# Patient Record
Sex: Male | Born: 1970 | Race: White | Hispanic: No | Marital: Single | Smoking: Current every day smoker
Health system: Southern US, Community
[De-identification: ages and names within clinical notes are randomized; demographics above are authoritative.]

## PROBLEM LIST (undated history)

## (undated) DIAGNOSIS — F1990 Other psychoactive substance use, unspecified, uncomplicated: Secondary | ICD-10-CM

## (undated) HISTORY — PX: NO PAST SURGERIES: SHX2092

---

## 2006-04-07 ENCOUNTER — Emergency Department: Payer: Self-pay | Admitting: Emergency Medicine

## 2010-04-23 ENCOUNTER — Emergency Department: Payer: Self-pay | Admitting: Emergency Medicine

## 2010-08-11 ENCOUNTER — Emergency Department: Payer: Self-pay | Admitting: Emergency Medicine

## 2011-05-31 ENCOUNTER — Emergency Department: Payer: Self-pay | Admitting: *Deleted

## 2011-06-02 ENCOUNTER — Emergency Department: Payer: Self-pay | Admitting: *Deleted

## 2011-10-18 ENCOUNTER — Emergency Department: Payer: Self-pay | Admitting: Emergency Medicine

## 2011-10-18 LAB — CBC WITH DIFFERENTIAL/PLATELET
Basophil #: 0.1 10*3/uL (ref 0.0–0.1)
Basophil %: 0.7 %
Eosinophil #: 0.4 10*3/uL (ref 0.0–0.7)
HGB: 14.4 g/dL (ref 13.0–18.0)
Lymphocyte #: 3.1 10*3/uL (ref 1.0–3.6)
MCH: 29.8 pg (ref 26.0–34.0)
MCHC: 33.7 g/dL (ref 32.0–36.0)
Monocyte #: 1 x10 3/mm (ref 0.2–1.0)
Neutrophil #: 13.2 10*3/uL — ABNORMAL HIGH (ref 1.4–6.5)
RBC: 4.81 10*6/uL (ref 4.40–5.90)
RDW: 13.4 % (ref 11.5–14.5)

## 2013-01-05 ENCOUNTER — Emergency Department: Payer: Self-pay | Admitting: Emergency Medicine

## 2013-06-28 ENCOUNTER — Encounter (HOSPITAL_COMMUNITY): Payer: Self-pay | Admitting: Emergency Medicine

## 2013-06-28 ENCOUNTER — Emergency Department (HOSPITAL_COMMUNITY): Payer: Self-pay

## 2013-06-28 ENCOUNTER — Emergency Department (HOSPITAL_COMMUNITY)
Admission: EM | Admit: 2013-06-28 | Discharge: 2013-06-28 | Disposition: A | Discharge status: Home / Self Care | Payer: Self-pay | Attending: Emergency Medicine | Admitting: Emergency Medicine

## 2013-06-28 DIAGNOSIS — R630 Anorexia: Secondary | ICD-10-CM | POA: Insufficient documentation

## 2013-06-28 DIAGNOSIS — N200 Calculus of kidney: Secondary | ICD-10-CM | POA: Insufficient documentation

## 2013-06-28 LAB — URINALYSIS, ROUTINE W REFLEX MICROSCOPIC
Bilirubin Urine: NEGATIVE
Glucose, UA: NEGATIVE mg/dL
Hgb urine dipstick: NEGATIVE
Ketones, ur: 15 mg/dL — AB
Leukocytes, UA: NEGATIVE
Nitrite: NEGATIVE
Protein, ur: NEGATIVE mg/dL
SPECIFIC GRAVITY, URINE: 1.038 — AB (ref 1.005–1.030)
UROBILINOGEN UA: 1 mg/dL (ref 0.0–1.0)
pH: 6 (ref 5.0–8.0)

## 2013-06-28 LAB — BASIC METABOLIC PANEL
BUN: 13 mg/dL (ref 6–23)
CO2: 28 mEq/L (ref 19–32)
Calcium: 9.8 mg/dL (ref 8.4–10.5)
Chloride: 99 mEq/L (ref 96–112)
Creatinine, Ser: 0.87 mg/dL (ref 0.50–1.35)
Glucose, Bld: 94 mg/dL (ref 70–99)
POTASSIUM: 4.5 meq/L (ref 3.7–5.3)
SODIUM: 141 meq/L (ref 137–147)

## 2013-06-28 MED ORDER — TRAMADOL HCL 50 MG PO TABS: 50.0000 mg | ORAL_TABLET | Freq: Four times a day (QID) | ORAL | Status: DC | PRN

## 2013-06-28 MED ORDER — KETOROLAC TROMETHAMINE 30 MG/ML IJ SOLN
30.0000 mg | Freq: Once | INTRAMUSCULAR | Status: AC
  Administered 2013-06-28: 30 mg via INTRAVENOUS
  Filled 2013-06-28: qty 1

## 2013-06-28 MED ORDER — HYDROCODONE-ACETAMINOPHEN 5-325 MG PO TABS: 1.0000 | ORAL_TABLET | ORAL | Status: DC | PRN

## 2013-06-28 MED ORDER — HYDROMORPHONE HCL PF 1 MG/ML IJ SOLN
1.0000 mg | Freq: Once | INTRAMUSCULAR | Status: AC
  Administered 2013-06-28: 1 mg via INTRAVENOUS
  Filled 2013-06-28: qty 1

## 2013-06-28 MED ORDER — SODIUM CHLORIDE 0.9 % IV BOLUS (SEPSIS)
1000.0000 mL | Freq: Once | INTRAVENOUS | Status: AC
  Administered 2013-06-28: 1000 mL via INTRAVENOUS

## 2013-06-28 NOTE — ED Notes (Signed)
Attempted iv access x 2 with no success

## 2013-06-28 NOTE — Discharge Instructions (Signed)
If you were given medicines take as directed.  If you are on coumadin or contraceptives realize their levels and effectiveness is altered by many different medicines.  If you have any reaction (rash, tongues swelling, other) to the medicines stop taking and see a physician.   Take ibuprofen 600 mg every 6 hrs for pain For severe pain take norco or vicodin however realize they have the potential for addiction and it can make you sleepy and has tylenol in it.  No operating machinery while taking.  Please follow up as directed and return to the ER or see a physician for new or worsening symptoms.  Thank you. Filed Vitals:   06/28/13 1400 06/28/13 1421 06/28/13 1545 06/28/13 1630  BP: 144/91 119/90 110/79 110/69  Pulse: 96 63 49 55  Temp: 98 F (36.7 C)     TempSrc: Oral     Resp: 17 10 9 15   Weight: 175 lb (79.379 kg)     SpO2: 100% 99% 100% 100%    Kidney Stones Kidney stones (urolithiasis) are solid masses that form inside your kidneys. The intense pain is caused by the stone moving through the kidney, ureter, bladder, and urethra (urinary tract). When the stone moves, the ureter starts to spasm around the stone. The stone is usually passed in your pee (urine).  HOME CARE  Drink enough fluids to keep your pee clear or pale yellow. This helps to get the stone out.  Strain all pee through the provided strainer. Do not pee without peeing through the strainer, not even once. If you pee the stone out, catch it in the strainer. The stone may be as small as a grain of salt. Take this to your doctor. This will help your doctor figure out what you can do to try to prevent more kidney stones.  Only take medicine as told by your doctor.  Follow up with your doctor as told.  Get follow-up X-rays as told by your doctor. GET HELP IF: You have pain that gets worse even if you have been taking pain medicine. GET HELP RIGHT AWAY IF:   Your pain does not get better with medicine.  You have a fever  or shaking chills.  Your pain increases and gets worse over 18 hours.  You have new belly (abdominal) pain.  You feel faint or pass out.  You are unable to pee. MAKE SURE YOU:   Understand these instructions.  Will watch your condition.  Will get help right away if you are not doing well or get worse. Document Released: 06/21/2007 Document Revised: 09/04/2012 Document Reviewed: 06/05/2012 [facility] Patient Information 2014 [employer], [place].

## 2013-06-28 NOTE — ED Notes (Signed)
Per pt sts right side pain that started about 3 am. Denies N,V. sts sharp stabbing. sts comes in waves. sts urine is darker.

## 2013-06-28 NOTE — ED Provider Notes (Signed)
CSN: [id number]     Arrival date & time 06/28/13  1354 History   First MD Initiated Contact with Patient 06/28/13 1411     Chief Complaint  Patient presents with  . Flank Pain     (Consider location/radiation/quality/duration/timing/severity/associated sxs/prior Treatment) HPI Comments: 43 year old male with no significant medical or surgical history presents with right flank pain. Issues mild ache however today became more severe where he is unable to comfortable. No history of similar or history of kidney stones. No injury or trauma. Patient denies urinary symptoms and no worsening with eating. No known gallbladder problems, fevers, chills. Nothing improves the pain. No significant radiation  Patient is a 43 y.o. male presenting with flank pain. The history is provided by the patient.  Flank Pain Pertinent negatives include no chest pain, no abdominal pain, no headaches and no shortness of breath.    History reviewed. No pertinent past medical history. History reviewed. No pertinent past surgical history. History reviewed. No pertinent family history. History  Substance Use Topics  . Smoking status: Never Smoker   . Smokeless tobacco: Not on file  . Alcohol Use: No    Review of Systems  Constitutional: Positive for appetite change. Negative for fever and chills.  HENT: Negative for congestion.   Eyes: Negative for visual disturbance.  Respiratory: Negative for shortness of breath.   Cardiovascular: Negative for chest pain.  Gastrointestinal: Negative for vomiting and abdominal pain.  Genitourinary: Positive for flank pain. Negative for dysuria.  Musculoskeletal: Negative for back pain, neck pain and neck stiffness.  Skin: Negative for rash.  Neurological: Negative for light-headedness and headaches.      Allergies  Morphine and related  Home Medications   Prior to Admission medications   Not on File   BP 119/90  Pulse 63  Temp(Src) 98 F (36.7 C) (Oral)  Resp  10  Wt 175 lb (79.379 kg)  SpO2 99% Physical Exam  Nursing note and vitals reviewed. Constitutional: He is oriented to person, place, and time. He appears well-developed and well-nourished.  HENT:  Head: Normocephalic and atraumatic.  Eyes: Conjunctivae are normal. Right eye exhibits no discharge. Left eye exhibits no discharge.  Neck: Normal range of motion. Neck supple. No tracheal deviation present.  Cardiovascular: Normal rate and regular rhythm.   Pulmonary/Chest: Effort normal and breath sounds normal.  Abdominal: Soft. He exhibits no distension. There is no tenderness. There is no guarding.  Musculoskeletal: He exhibits tenderness (mild right mid flank). He exhibits no edema.  Neurological: He is alert and oriented to person, place, and time.  Skin: Skin is warm. No rash noted.  Psychiatric: He has a normal mood and affect.    ED Course  ProceduresAnd (including critical care time) Emergency Focused Ultrasound Exam Limited retroperitoneal ultrasound of kidneys  Performed and interpreted by Dr. Jodi MourningZavitz Indication: flank pain Focused abdominal ultrasound with both kidneys imaged in transverse and longitudinal planes in real-time. Interpretation: no hydronephrosis visualized.  no stones or cysts visualized  Images archived electronically  Labs Review Labs Reviewed  URINALYSIS, ROUTINE W REFLEX MICROSCOPIC - Abnormal; Notable for the following:    Specific Gravity, Urine 1.038 (*)    Ketones, ur 15 (*)    All other components within normal limits  BASIC METABOLIC PANEL    Imaging Review Ct Abdomen Pelvis Wo Contrast  06/28/2013   CLINICAL DATA:  Right-sided flank pain. Low back pain beginning at 3 a.m.  EXAM: CT ABDOMEN AND PELVIS WITHOUT CONTRAST  TECHNIQUE: Multidetector CT  imaging of the abdomen and pelvis was performed following the standard protocol without IV contrast.  COMPARISON:  None.  FINDINGS: Mild dependent atelectasis is present bilaterally. The lung bases are  otherwise clear. Heart size is normal. No significant pleural or pericardial effusion is present.  The liver and spleen are within normal limits. The stomach, duodenum, and pancreas are unremarkable. The common bile duct and gallbladder are normal. The adrenal glands are normal bilaterally. The left kidney and ureter are normal. A punctate stone is present at the lower pole of the right kidney. There are no obstructing stones. There is no hydronephrosis. The urinary bladder is mostly collapsed.  Diverticular changes are present throughout the sigmoid colon. There is no focal inflammation to suggest diverticulitis. The remainder the colon is within normal limits. The appendix is visualized and normal. Small bowel is unremarkable. No significant adenopathy or free fluid is present.  There is some fat extending into the inguinal canals bilaterally without associated bowel.  The bone windows demonstrate no focal lytic or blastic lesions.  IMPRESSION: 1. Punctate nonobstructing stone at the lower pole of the right kidney. 2. No evidence for obstruction.  The right ureter is normal. 3. No other acute or focal lesion to explain the patient's symptoms.   Electronically Signed   By: Gennette Pachris  Mattern M.D.   On: 06/28/2013 16:34     EKG Interpretation None      MDM   Final diagnoses:  Right kidney stone   clinical concern for acute kidney stone with other differentials considered  including gallbladder, musculoskeletal, other. Bedside ultrasound performed no significant hydronephrosis visualized and with no known kidney stone history no history of CT scan for similar and discussed CT scan to confirm diagnosis patient understands it really changes medical management and he can followup with urology if it is a kidney stone.  Pain medicines, IV fluids.  Pt improved on recheck, small kidney stone.  Results and differential diagnosis were discussed with the patient/parent/guardian. Close follow up outpatient was  discussed, comfortable with the plan.   Medications  HYDROmorphone (DILAUDID) injection 1 mg (1 mg Intravenous Given 06/28/13 1507)  ketorolac (TORADOL) 30 MG/ML injection 30 mg (30 mg Intravenous Given 06/28/13 1507)  sodium chloride 0.9 % bolus 1,000 mL (1,000 mLs Intravenous New Bag/Given 06/28/13 1506)    Filed Vitals:   06/28/13 1400 06/28/13 1421  BP: 144/91 119/90  Pulse: 96 63  Temp: 98 F (36.7 C)   TempSrc: Oral   Resp: 17 10  Weight: 175 lb (79.379 kg)   SpO2: 100% 99%       Enid SkeensJoshua M Gizel Riedlinger, MD 06/28/13 1648

## 2013-06-28 NOTE — ED Notes (Signed)
Dr Jodi Mourningzavitz at bedside to eval pt

## 2013-06-28 NOTE — ED Notes (Signed)
The patient is unable to give an urine specimen at this time. The patient has been advised to use the call light for assistance. The tech has reported to the RN in charge.

## 2013-06-28 NOTE — ED Notes (Signed)
Patient transported to CT 

## 2013-06-30 ENCOUNTER — Encounter (HOSPITAL_COMMUNITY): Payer: Self-pay | Admitting: Emergency Medicine

## 2013-06-30 ENCOUNTER — Emergency Department (HOSPITAL_COMMUNITY)
Admission: EM | Admit: 2013-06-30 | Discharge: 2013-06-30 | Disposition: A | Discharge status: Home / Self Care | Payer: Self-pay | Attending: Emergency Medicine | Admitting: Emergency Medicine

## 2013-06-30 DIAGNOSIS — D72829 Elevated white blood cell count, unspecified: Secondary | ICD-10-CM | POA: Insufficient documentation

## 2013-06-30 DIAGNOSIS — Z87442 Personal history of urinary calculi: Secondary | ICD-10-CM | POA: Insufficient documentation

## 2013-06-30 DIAGNOSIS — R109 Unspecified abdominal pain: Secondary | ICD-10-CM | POA: Insufficient documentation

## 2013-06-30 DIAGNOSIS — R300 Dysuria: Secondary | ICD-10-CM | POA: Insufficient documentation

## 2013-06-30 LAB — CBC WITH DIFFERENTIAL/PLATELET
BASOS PCT: 0 % (ref 0–1)
Basophils Absolute: 0 10*3/uL (ref 0.0–0.1)
EOS ABS: 0 10*3/uL (ref 0.0–0.7)
Eosinophils Relative: 0 % (ref 0–5)
HCT: 45.1 % (ref 39.0–52.0)
Hemoglobin: 15.5 g/dL (ref 13.0–17.0)
Lymphocytes Relative: 11 % — ABNORMAL LOW (ref 12–46)
Lymphs Abs: 1.6 10*3/uL (ref 0.7–4.0)
MCH: 28.8 pg (ref 26.0–34.0)
MCHC: 34.4 g/dL (ref 30.0–36.0)
MCV: 83.8 fL (ref 78.0–100.0)
Monocytes Absolute: 0.3 10*3/uL (ref 0.1–1.0)
Monocytes Relative: 2 % — ABNORMAL LOW (ref 3–12)
NEUTROS PCT: 87 % — AB (ref 43–77)
Neutro Abs: 12.6 10*3/uL — ABNORMAL HIGH (ref 1.7–7.7)
PLATELETS: 336 10*3/uL (ref 150–400)
RBC: 5.38 MIL/uL (ref 4.22–5.81)
RDW: 13.7 % (ref 11.5–15.5)
WBC: 14.6 10*3/uL — ABNORMAL HIGH (ref 4.0–10.5)

## 2013-06-30 LAB — URINALYSIS, ROUTINE W REFLEX MICROSCOPIC
BILIRUBIN URINE: NEGATIVE
Glucose, UA: NEGATIVE mg/dL
Ketones, ur: >80 mg/dL — AB
Leukocytes, UA: NEGATIVE
Nitrite: NEGATIVE
PROTEIN: NEGATIVE mg/dL
Specific Gravity, Urine: 1.023 (ref 1.005–1.030)
UROBILINOGEN UA: 0.2 mg/dL (ref 0.0–1.0)
pH: 8.5 — ABNORMAL HIGH (ref 5.0–8.0)

## 2013-06-30 LAB — BASIC METABOLIC PANEL
BUN: 9 mg/dL (ref 6–23)
CALCIUM: 10.2 mg/dL (ref 8.4–10.5)
CO2: 18 mEq/L — ABNORMAL LOW (ref 19–32)
Chloride: 103 mEq/L (ref 96–112)
Creatinine, Ser: 0.82 mg/dL (ref 0.50–1.35)
Glucose, Bld: 135 mg/dL — ABNORMAL HIGH (ref 70–99)
Potassium: 3.9 mEq/L (ref 3.7–5.3)
SODIUM: 141 meq/L (ref 137–147)

## 2013-06-30 LAB — URINE MICROSCOPIC-ADD ON

## 2013-06-30 MED ORDER — KETOROLAC TROMETHAMINE 30 MG/ML IJ SOLN
30.0000 mg | Freq: Once | INTRAMUSCULAR | Status: AC
  Administered 2013-06-30: 30 mg via INTRAVENOUS
  Filled 2013-06-30: qty 1

## 2013-06-30 MED ORDER — HYDROMORPHONE HCL PF 1 MG/ML IJ SOLN
1.0000 mg | Freq: Once | INTRAMUSCULAR | Status: AC
  Administered 2013-06-30: 1 mg via INTRAVENOUS
  Filled 2013-06-30: qty 1

## 2013-06-30 MED ORDER — ONDANSETRON HCL 4 MG/2ML IJ SOLN
4.0000 mg | Freq: Once | INTRAMUSCULAR | Status: AC
  Administered 2013-06-30: 4 mg via INTRAVENOUS
  Filled 2013-06-30: qty 2

## 2013-06-30 MED ORDER — OXYCODONE HCL 5 MG PO TABS
5.0000 mg | ORAL_TABLET | Freq: Once | ORAL | Status: AC
  Administered 2013-06-30: 5 mg via ORAL
  Filled 2013-06-30: qty 1

## 2013-06-30 MED ORDER — ONDANSETRON HCL 4 MG PO TABS: 4.0000 mg | ORAL_TABLET | Freq: Four times a day (QID) | ORAL | Status: DC

## 2013-06-30 MED ORDER — OXYCODONE HCL 5 MG PO TABS: 5.0000 mg | ORAL_TABLET | Freq: Four times a day (QID) | ORAL | Status: DC | PRN

## 2013-06-30 NOTE — ED Provider Notes (Signed)
CSN: [id number]     Arrival date & time 06/30/13  1248 History   First MD Initiated Contact with Patient 06/30/13 1406     Chief Complaint  Patient presents with  . Flank Pain     (Consider location/radiation/quality/duration/timing/severity/associated sxs/prior Treatment) HPI Comments: Patient presents emergency department with chief complaint of right flank pain. He states that last week he was diagnosed with a kidney stone. He states that it is not past it. He states that he has run out of his pain medicine. States the pain worsened this morning. There no aggravating or alleviating factors. He does report some dysuria. He denies any fevers, chills, nausea, vomiting. States the pain is 10 out of 10. He has not followed up with urology.  The history is provided by the patient. No language interpreter was used.    History reviewed. No pertinent past medical history. History reviewed. No pertinent past surgical history. No family history on file. History  Substance Use Topics  . Smoking status: Never Smoker   . Smokeless tobacco: Not on file  . Alcohol Use: No    Review of Systems  All other systems reviewed and are negative.     Allergies  Morphine and related  Home Medications   Prior to Admission medications   Medication Sig Start Date End Date Taking? Authorizing Provider  HYDROcodone-acetaminophen (NORCO) 5-325 MG per tablet Take 1-2 tablets by mouth every 4 (four) hours as needed. 06/28/13  Yes Enid Skeens, MD  traMADol (ULTRAM) 50 MG tablet Take 1 tablet (50 mg total) by mouth every 6 (six) hours as needed. 06/28/13  Yes Enid Skeens, MD   BP 128/70  Pulse 80  Temp(Src) 98.5 F (36.9 C) (Oral)  Resp 24  SpO2 100% Physical Exam  Nursing note and vitals reviewed. Constitutional: He is oriented to person, place, and time. He appears well-developed and well-nourished.  Obviously uncomfortable  HENT:  Head: Normocephalic and atraumatic.  Eyes: Conjunctivae  and EOM are normal. Pupils are equal, round, and reactive to light. Right eye exhibits no discharge. Left eye exhibits no discharge. No scleral icterus.  Neck: Normal range of motion. Neck supple. No JVD present.  Cardiovascular: Normal rate, regular rhythm and normal heart sounds.  Exam reveals no gallop and no friction rub.   No murmur heard. Pulmonary/Chest: Effort normal and breath sounds normal. No respiratory distress. He has no wheezes. He has no rales. He exhibits no tenderness.  Abdominal: Soft. He exhibits no distension and no mass. There is no tenderness. There is no rebound and no guarding.  Right-sided CVA tenderness, no focal abdominal tenderness  Musculoskeletal: Normal range of motion. He exhibits no edema and no tenderness.  Neurological: He is alert and oriented to person, place, and time.  Skin: Skin is warm and dry.  Psychiatric: He has a normal mood and affect. His behavior is normal. Judgment and thought content normal.    ED Course  Procedures (including critical care time) Results for orders placed during the hospital encounter of 06/30/13  URINALYSIS, ROUTINE W REFLEX MICROSCOPIC      Result Value Ref Range   Color, Urine YELLOW  YELLOW   APPearance CLEAR  CLEAR   Specific Gravity, Urine 1.023  1.005 - 1.030   pH 8.5 (*) 5.0 - 8.0   Glucose, UA NEGATIVE  NEGATIVE mg/dL   Hgb urine dipstick TRACE (*) NEGATIVE   Bilirubin Urine NEGATIVE  NEGATIVE   Ketones, ur >80 (*) NEGATIVE mg/dL  Protein, ur NEGATIVE  NEGATIVE mg/dL   Urobilinogen, UA 0.2  0.0 - 1.0 mg/dL   Nitrite NEGATIVE  NEGATIVE   Leukocytes, UA NEGATIVE  NEGATIVE  URINE MICROSCOPIC-ADD ON      Result Value Ref Range   Squamous Epithelial / LPF RARE  RARE   WBC, UA 0-2  <3 WBC/hpf   RBC / HPF 0-2  <3 RBC/hpf   Bacteria, UA FEW (*) RARE   Urine-Other MUCOUS PRESENT    CBC WITH DIFFERENTIAL      Result Value Ref Range   WBC 14.6 (*) 4.0 - 10.5 K/uL   RBC 5.38  4.22 - 5.81 MIL/uL   Hemoglobin  15.5  13.0 - 17.0 g/dL   HCT 02.745.1  25.339.0 - 66.452.0 %   MCV 83.8  78.0 - 100.0 fL   MCH 28.8  26.0 - 34.0 pg   MCHC 34.4  30.0 - 36.0 g/dL   RDW 40.313.7  47.411.5 - 25.915.5 %   Platelets 336  150 - 400 K/uL   Neutrophils Relative % 87 (*) 43 - 77 %   Neutro Abs 12.6 (*) 1.7 - 7.7 K/uL   Lymphocytes Relative 11 (*) 12 - 46 %   Lymphs Abs 1.6  0.7 - 4.0 K/uL   Monocytes Relative 2 (*) 3 - 12 %   Monocytes Absolute 0.3  0.1 - 1.0 K/uL   Eosinophils Relative 0  0 - 5 %   Eosinophils Absolute 0.0  0.0 - 0.7 K/uL   Basophils Relative 0  0 - 1 %   Basophils Absolute 0.0  0.0 - 0.1 K/uL  BASIC METABOLIC PANEL      Result Value Ref Range   Sodium 141  137 - 147 mEq/L   Potassium 3.9  3.7 - 5.3 mEq/L   Chloride 103  96 - 112 mEq/L   CO2 18 (*) 19 - 32 mEq/L   Glucose, Bld 135 (*) 70 - 99 mg/dL   BUN 9  6 - 23 mg/dL   Creatinine, Ser 5.630.82  0.50 - 1.35 mg/dL   Calcium 87.510.2  8.4 - 64.310.5 mg/dL   GFR calc non Af Amer >90  >90 mL/min   GFR calc Af Amer >90  >90 mL/min   Ct Abdomen Pelvis Wo Contrast  06/28/2013   CLINICAL DATA:  Right-sided flank pain. Low back pain beginning at 3 a.m.  EXAM: CT ABDOMEN AND PELVIS WITHOUT CONTRAST  TECHNIQUE: Multidetector CT imaging of the abdomen and pelvis was performed following the standard protocol without IV contrast.  COMPARISON:  None.  FINDINGS: Mild dependent atelectasis is present bilaterally. The lung bases are otherwise clear. Heart size is normal. No significant pleural or pericardial effusion is present.  The liver and spleen are within normal limits. The stomach, duodenum, and pancreas are unremarkable. The common bile duct and gallbladder are normal. The adrenal glands are normal bilaterally. The left kidney and ureter are normal. A punctate stone is present at the lower pole of the right kidney. There are no obstructing stones. There is no hydronephrosis. The urinary bladder is mostly collapsed.  Diverticular changes are present throughout the sigmoid colon. There  is no focal inflammation to suggest diverticulitis. The remainder the colon is within normal limits. The appendix is visualized and normal. Small bowel is unremarkable. No significant adenopathy or free fluid is present.  There is some fat extending into the inguinal canals bilaterally without associated bowel.  The bone windows demonstrate no focal lytic or blastic  lesions.  IMPRESSION: 1. Punctate nonobstructing stone at the lower pole of the right kidney. 2. No evidence for obstruction.  The right ureter is normal. 3. No other acute or focal lesion to explain the patient's symptoms.   Electronically Signed   By: Gennette Pachris  Mattern M.D.   On: 06/28/2013 16:34     Imaging Review Ct Abdomen Pelvis Wo Contrast  06/28/2013   CLINICAL DATA:  Right-sided flank pain. Low back pain beginning at 3 a.m.  EXAM: CT ABDOMEN AND PELVIS WITHOUT CONTRAST  TECHNIQUE: Multidetector CT imaging of the abdomen and pelvis was performed following the standard protocol without IV contrast.  COMPARISON:  None.  FINDINGS: Mild dependent atelectasis is present bilaterally. The lung bases are otherwise clear. Heart size is normal. No significant pleural or pericardial effusion is present.  The liver and spleen are within normal limits. The stomach, duodenum, and pancreas are unremarkable. The common bile duct and gallbladder are normal. The adrenal glands are normal bilaterally. The left kidney and ureter are normal. A punctate stone is present at the lower pole of the right kidney. There are no obstructing stones. There is no hydronephrosis. The urinary bladder is mostly collapsed.  Diverticular changes are present throughout the sigmoid colon. There is no focal inflammation to suggest diverticulitis. The remainder the colon is within normal limits. The appendix is visualized and normal. Small bowel is unremarkable. No significant adenopathy or free fluid is present.  There is some fat extending into the inguinal canals bilaterally  without associated bowel.  The bone windows demonstrate no focal lytic or blastic lesions.  IMPRESSION: 1. Punctate nonobstructing stone at the lower pole of the right kidney. 2. No evidence for obstruction.  The right ureter is normal. 3. No other acute or focal lesion to explain the patient's symptoms.   Electronically Signed   By: Gennette Pachris  Mattern M.D.   On: 06/28/2013 16:34     EKG Interpretation None      MDM   Final diagnoses:  Flank pain    Patient with recent diagnosis kidney stone. Pain is 10 out of 10 here. Will treat pain, and will check labs.  Mild leukocytosis, however no UTI. No fever. Patient tolerating oral intake.  Patient states that his pain is much improved. Will discharge to home with some pain medicine. Recommend urology followup in the next couple of days. Continue to strain urine. Return precautions given. Patient understands and agrees with plan. He is stable and ready for discharge.    Roxy Horsemanobert Vallorie Niccoli, PA-C 06/30/13 1550

## 2013-06-30 NOTE — ED Notes (Signed)
Pt states that he N/V as well.

## 2013-06-30 NOTE — ED Provider Notes (Signed)
  Medical screening examination/treatment/procedure(s) were performed by non-physician practitioner and as supervising physician I was immediately available for consultation/collaboration.   EKG Interpretation None         Alexios Keown, MD 06/30/13 1625 

## 2013-06-30 NOTE — ED Notes (Signed)
Pt states that he has rt flank pain that has been ongoing since last week. Pt states that he was seen for a kidney stone and given medications. Pt states that pain has persisted. Pt states that he has been having difficulty urinating and urine has been cloudy.

## 2013-06-30 NOTE — Discharge Instructions (Signed)

## 2013-06-30 NOTE — Discharge Planning (Signed)
[facility] Community Liaison  Spoke to patient about primary care resources and establishing care with a provider. Resource guide and my contact information was provided for any future questions or concerns. No other needs expressed at this time.

## 2014-10-05 ENCOUNTER — Emergency Department
Admission: EM | Admit: 2014-10-05 | Discharge: 2014-10-06 | Disposition: A | Discharge status: Home / Self Care | Payer: Self-pay | Attending: Emergency Medicine | Admitting: Emergency Medicine

## 2014-10-05 ENCOUNTER — Encounter: Payer: Self-pay | Admitting: *Deleted

## 2014-10-05 DIAGNOSIS — R000 Tachycardia, unspecified: Secondary | ICD-10-CM | POA: Insufficient documentation

## 2014-10-05 DIAGNOSIS — Y92002 Bathroom of unspecified non-institutional (private) residence single-family (private) house as the place of occurrence of the external cause: Secondary | ICD-10-CM | POA: Insufficient documentation

## 2014-10-05 DIAGNOSIS — Y9389 Activity, other specified: Secondary | ICD-10-CM | POA: Insufficient documentation

## 2014-10-05 DIAGNOSIS — S99921A Unspecified injury of right foot, initial encounter: Secondary | ICD-10-CM | POA: Insufficient documentation

## 2014-10-05 DIAGNOSIS — Y998 Other external cause status: Secondary | ICD-10-CM | POA: Insufficient documentation

## 2014-10-05 DIAGNOSIS — Z79899 Other long term (current) drug therapy: Secondary | ICD-10-CM | POA: Insufficient documentation

## 2014-10-05 DIAGNOSIS — S99922A Unspecified injury of left foot, initial encounter: Secondary | ICD-10-CM | POA: Insufficient documentation

## 2014-10-05 DIAGNOSIS — T401X1A Poisoning by heroin, accidental (unintentional), initial encounter: Secondary | ICD-10-CM | POA: Insufficient documentation

## 2014-10-05 DIAGNOSIS — W228XXA Striking against or struck by other objects, initial encounter: Secondary | ICD-10-CM | POA: Insufficient documentation

## 2014-10-05 LAB — CBC WITH DIFFERENTIAL/PLATELET
Basophils Absolute: 0.1 10*3/uL (ref 0–0.1)
Basophils Relative: 0 %
EOS PCT: 1 %
Eosinophils Absolute: 0.1 10*3/uL (ref 0–0.7)
HEMATOCRIT: 42.5 % (ref 40.0–52.0)
Hemoglobin: 14.2 g/dL (ref 13.0–18.0)
LYMPHS PCT: 6 %
Lymphs Abs: 0.9 10*3/uL — ABNORMAL LOW (ref 1.0–3.6)
MCH: 28.4 pg (ref 26.0–34.0)
MCHC: 33.5 g/dL (ref 32.0–36.0)
MCV: 84.9 fL (ref 80.0–100.0)
MONOS PCT: 6 %
Monocytes Absolute: 1 10*3/uL (ref 0.2–1.0)
NEUTROS ABS: 14.1 10*3/uL — AB (ref 1.4–6.5)
Neutrophils Relative %: 87 %
Platelets: 229 10*3/uL (ref 150–440)
RBC: 5.01 MIL/uL (ref 4.40–5.90)
RDW: 15.1 % — ABNORMAL HIGH (ref 11.5–14.5)
WBC: 16.2 10*3/uL — ABNORMAL HIGH (ref 3.8–10.6)

## 2014-10-05 LAB — BASIC METABOLIC PANEL
ANION GAP: 7 (ref 5–15)
BUN: 15 mg/dL (ref 6–20)
CHLORIDE: 100 mmol/L — AB (ref 101–111)
CO2: 28 mmol/L (ref 22–32)
Calcium: 9.3 mg/dL (ref 8.9–10.3)
Creatinine, Ser: 1.08 mg/dL (ref 0.61–1.24)
GFR calc Af Amer: >60 mL/min (ref >60)
GFR calc non Af Amer: >60 mL/min (ref >60)
GLUCOSE: 147 mg/dL — AB (ref 65–99)
Potassium: 4.2 mmol/L (ref 3.5–5.1)
Sodium: 135 mmol/L (ref 135–145)

## 2014-10-05 MED ORDER — SODIUM CHLORIDE 0.9 % IV BOLUS (SEPSIS)
1000.0000 mL | Freq: Once | INTRAVENOUS | Status: AC
  Administered 2014-10-05: 1000 mL via INTRAVENOUS

## 2014-10-05 NOTE — ED Notes (Signed)
Pt asked to give a urine sample, again.  Pt states that he will try, pts family at the bedside and they are very agitated.

## 2014-10-05 NOTE — ED Notes (Signed)
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  Father is persistant at transferring pt out of this hospital.  Notified Dr York Cerise and Tacey Ruiz the charge nurse.

## 2014-10-05 NOTE — ED Provider Notes (Signed)
[facility] Emergency Department Stephanie Littman Note   ____________________________________________  Time seen: 9:15 PM I have reviewed the triage vital signs and the triage nursing note.  HISTORY  Chief Complaint Drug Overdose   Historian Patient, brother and father  HPI Jason Frazier is a 44 y.o. male who has a history of prior to your drug abuse with heroin, who reports being clean for 6 months, and shot IV heroine tonight. Patient started to feel lightheaded and dizzy and got the sensation that he couldn't move, and so he started kicking and struck his toes against the walls or furniture in the bathroom and is complaining of left top of the foot pain and right third fourth and fifth toe pain. Father and brother found the patient difficult to arouse and EMS gave 2 mg of Narcan in the recent did become alert. Patient denies any other drugs of abuse.  Patient is under some stress due to a custody battle over his young daughter.    No past medical history on file. history of heroin abuse.  There are no active problems to display for this patient.   No past surgical history on file.  Current Outpatient Rx  Name  Route  Sig  Dispense  Refill  . HYDROcodone-acetaminophen (NORCO) 5-325 MG per tablet   Oral   Take 1-2 tablets by mouth every 4 (four) hours as needed.   15 tablet   0   . ondansetron (ZOFRAN) 4 MG tablet   Oral   Take 1 tablet (4 mg total) by mouth every 6 (six) hours.   12 tablet   0   . oxyCODONE (ROXICODONE) 5 MG immediate release tablet   Oral   Take 1 tablet (5 mg total) by mouth every 6 (six) hours as needed for severe pain.   15 tablet   0   . traMADol (ULTRAM) 50 MG tablet   Oral   Take 1 tablet (50 mg total) by mouth every 6 (six) hours as needed.   10 tablet   0     Allergies Morphine and related  History reviewed. No pertinent family history.  Social History Social History  Substance Use Topics  . Smoking status:  Never Smoker   . Smokeless tobacco: None  . Alcohol Use: No    Review of Systems  Constitutional: Negative for fever. Eyes: Negative for visual changes. ENT: Negative for sore throat. Cardiovascular: Negative for chest pain. Respiratory: Negative for shortness of breath. Gastrointestinal: Negative for abdominal pain, vomiting and diarrhea. Genitourinary: Negative for dysuria. Musculoskeletal: Negative for back pain. Skin: Negative for rash. Neurological: Negative for headache. 10 point Review of Systems otherwise negative ____________________________________________   PHYSICAL EXAM:  VITAL SIGNS: ED Triage Vitals  Enc Vitals Group     BP 10/05/14 2120 141/89 mmHg     Pulse --      Resp 10/05/14 2120 16     Temp 10/05/14 2120 98.6 F (37 C)     Temp Source 10/05/14 2120 Oral     SpO2 10/05/14 2120 98 %     Weight 10/05/14 2120 180 lb (81.647 kg)     Height 10/05/14 2120  (1.778 m)     Head Cir --      Peak Flow --      Pain Score --      Pain Loc --      Pain Edu? --      Excl. in GC? --  Constitutional: Alert and oriented. Well appearing and in no distress. Eyes: Conjunctivae are mildly injected. PERRL. Normal extraocular movements. ENT   Head: Normocephalic and atraumatic.   Nose: No congestion/rhinnorhea.   Mouth/Throat: Mucous membranes are moist.   Neck: No stridor. Cardiovascular/Chest: Tachycardic, regular..  No murmurs, rubs, or gallops. Respiratory: Normal respiratory effort without tachypnea nor retractions. Breath sounds are clear and equal bilaterally. No wheezes/rales/rhonchi. Gastrointestinal: Soft. No distention, no guarding, no rebound. Nontender   Genitourinary/rectal:Deferred Musculoskeletal: Normal range of motion in all extremities. No joint effusions.  No lower extremity edema. Tenderness to the left top of the foot with mild swelling, tenderness at the third fourth and fifth tips of the toes. Neurologic:  Normal speech  and language. No gross or focal neurologic deficits are appreciated. Skin:  Skin is warm, dry and intact. No rash noted. Psychiatric: Mood and affect are normal. Speech and behavior are normal. Patient is remorseful. No depression or psychosis. No suicidal or homicidal ideation. Patient exhibits appropriate insight and judgment.  ____________________________________________   EKG I, Governor Rooks, MD, the attending physician have personally viewed and interpreted all ECGs.  101 bpm. Sinus tachycardia. Narrow QRS. Normal axis. Nonspecific T wave. QTc 451 ____________________________________________  LABS (pertinent positives/negatives)  Basic metabolic panel without significant abnormalities White blood cell count 16.2 with left shift, hemoglobin 14.2 and platelet count 229 Urinalysis and urine drug screen are pending Alcohol level pending  ____________________________________________  RADIOLOGY All Xrays were viewed by me. Imaging interpreted by Radiologist.  None __________________________________________  PROCEDURES  Procedure(s) performed: None  Critical Care performed: None  ____________________________________________   ED COURSE / ASSESSMENT AND PLAN  CONSULTATIONS: Consultation to TTS, for evaluation of outpatient options for drug abuse.  Pertinent labs & imaging results that were available during my care of the patient were reviewed by me and considered in my medical decision making (see chart for details).  Patient is currently awake and remorseful over returning to IV heroin use tonight with accidental/unintentional overdose. There is no active depression or suicidal ideation. Patient will be monitored for several hours to assure stability and no need for additional Narcan.  I don't feel the patient needs inpatient psychiatry, he appears to have good social network with his father and brother and is remorseful about what happened tonight. I will have the  psychiatry social worker talk with the patient to help with outpatient resources.  ----------------------------------------- 11:38 PM on 10/05/2014 -----------------------------------------  No additional somnolence or evidence of return of opioid intoxication. Patient will continue to be monitored for several hours. Patient care transferred to Dr. Manson Passey, midnight. Urinalysis, urine drug screen, and alcohol level are pending.  Patient / Family / Caregiver informed of clinical course, medical decision-making process, and agree with plan.    ___________________________________________   FINAL CLINICAL IMPRESSION(S) / ED DIAGNOSES   Final diagnoses:  Heroin overdose, accidental or unintentional, initial encounter       Governor Rooks, MD 10/05/14 2340

## 2014-10-05 NOTE — ED Notes (Signed)
2 mg Narcan per EMS, pt was more arousable.  Pt id A&O x 4 at this time, Dr Shaune Pollack at bedside, spoke with family.  Father much calmer at this time.

## 2014-10-06 ENCOUNTER — Emergency Department: Payer: Self-pay

## 2014-10-06 LAB — ETHANOL

## 2014-10-06 MED ORDER — ACETAMINOPHEN 325 MG PO TABS
ORAL_TABLET | ORAL | Status: AC
  Administered 2014-10-06: 650 mg via ORAL
  Filled 2014-10-06: qty 2

## 2014-10-06 MED ORDER — ACETAMINOPHEN 325 MG PO TABS
650.0000 mg | ORAL_TABLET | Freq: Once | ORAL | Status: AC
  Administered 2014-10-06: 650 mg via ORAL

## 2014-10-06 NOTE — BH Assessment (Signed)
Assessment Note  Jason Frazier is an 44 y.o. male. Jason Frazier reports to the ED by EMS after what he reports as an accidental overdose.  He reports that he had previously use drugs for 2 years. He was able to be clean for 6 months, until today.  He reports a friend left a bag of heroin at his home and he used it.  He states that he did not know it would be that strong, and he accidentally overdosed.  He denied being depressed or anxious.  He denied heaving auditory or visual hallucinations. He denied suicidal or homicidal ideation or intent.   Axis I: Substance Abuse Axis II: Deferred Axis III: No past medical history on file. Axis IV: problems with primary support group Axis V: 41-50 serious symptoms  Past Medical History: No past medical history on file.  No past surgical history on file.  Family History: History reviewed. No pertinent family history.  Social History:  reports that he has never smoked. He does not have any smokeless tobacco history on file. He reports that he uses illicit drugs (IV). He reports that he does not drink alcohol.  Additional Social History:  Alcohol / Drug Use History of alcohol / drug use?: Yes (Reports stopped using 6 months ago, found a bag in the home and he relapsed) Longest period of sobriety (when/how long): 6 months  CIWA: CIWA-Ar BP: (!) 135/108 mmHg Pulse Rate: (!) 122 COWS:    Allergies:  Allergies  Allergen Reactions  . Morphine And Related Hives    Home Medications:  (Not in a hospital admission)  OB/GYN Status:  No LMP for male patient.  General Assessment Data Location of Assessment: [facility] ED TTS Assessment: In system Is this a Tele or Face-to-Face Assessment?: Face-to-Face Is this an Initial Assessment or a Re-assessment for this encounter?: Initial Assessment Marital status: Single Maiden name: n/a Is patient pregnant?: No Pregnancy Status: No Living Arrangements: Other relatives Can pt return to current living  arrangement?: Yes Admission Status: Voluntary Is patient capable of signing voluntary admission?: Yes Referral Source: Other Insurance type: None reported  Medical Screening Exam [facility] Walk-in ONLY) Medical Exam completed: Yes  Crisis Care Plan Living Arrangements: Other relatives Name of Psychiatrist: None  Name of Therapist: None  Education Status Is patient currently in school?: No Current Grade: n/a Highest grade of school patient has completed: 12th Name of school: n/a Contact person: n/a  Risk to self with the past 6 months Suicidal Ideation: No Has patient been a risk to self within the past 6 months prior to admission? : No Suicidal Intent: No Has patient had any suicidal intent within the past 6 months prior to admission? : No Is patient at risk for suicide?: No Suicidal Plan?: No Has patient had any suicidal plan within the past 6 months prior to admission? : No Access to Means: No What has been your use of drugs/alcohol within the last 12 months?: Heroin Previous Attempts/Gestures: No How many times?: 0 Other Self Harm Risks: None reported Triggers for Past Attempts: None known Intentional Self Injurious Behavior: None Family Suicide History: No Recent stressful life event(s): Conflict (Comment) (Custody court hearing ) Persecutory voices/beliefs?: No Depression: No Depression Symptoms:  (denied) Substance abuse history and/or treatment for substance abuse?: Yes Suicide prevention information given to non-admitted patients: Not applicable  Risk to Others within the past 6 months Homicidal Ideation: No Does patient have any lifetime risk of violence toward others beyond the six months prior  to admission? : No Thoughts of Harm to Others: No Current Homicidal Intent: No Current Homicidal Plan: No Access to Homicidal Means: No Identified Victim: None reported History of harm to others?: No Assessment of Violence: None Noted Violent Behavior Description:  None reported Criminal Charges Pending?: No Does patient have a court date: No Is patient on probation?: No  Psychosis Hallucinations: None noted Delusions: None noted  Mental Status Report Appearance/Hygiene: Unremarkable Eye Contact: Fair Motor Activity: Unremarkable Speech: Unremarkable Level of Consciousness: Alert Mood: Silly Affect: Silly Anxiety Level: None Thought Processes: Relevant Judgement: Partial Orientation: Person, Place, Situation, Time Obsessive Compulsive Thoughts/Behaviors: None  Cognitive Functioning Concentration: Normal Memory: Recent Intact Insight: Fair Impulse Control: Fair Appetite: Good Sleep: No Change Vegetative Symptoms: None  ADLScreening [facility] Assessment Services) Patient's cognitive ability adequate to safely complete daily activities?: Yes Patient able to express need for assistance with ADLs?: Yes Independently performs ADLs?: Yes (appropriate for developmental age)  Prior Inpatient Therapy Prior Inpatient Therapy: No  Prior Outpatient Therapy Prior Outpatient Therapy: No Does patient have an ACCT team?: No Does patient have Intensive In-House Services?  : No Does patient have [facility] services? : No Does patient have P4CC services?: No  ADL Screening (condition at time of admission) Patient's cognitive ability adequate to safely complete daily activities?: Yes Patient able to express need for assistance with ADLs?: Yes Independently performs ADLs?: Yes (appropriate for developmental age)       Abuse/Neglect Assessment (Assessment to be complete while patient is alone) Physical Abuse: Denies Verbal Abuse: Denies Sexual Abuse: Denies Exploitation of patient/patient's resources: Denies Self-Neglect: Denies Values / Beliefs Cultural Requests During Hospitalization: None Spiritual Requests During Hospitalization: None   Advance Directives (For Healthcare) Does patient have an advance directive?: No Would patient like  information on creating an advanced directive?: No - patient declined information    Additional Information 1:1 In Past 12 Months?: No CIRT Risk: No Elopement Risk: No Does patient have medical clearance?: Yes     Disposition:  Disposition Initial Assessment Completed for this Encounter: Yes Disposition of Patient: Outpatient treatment  On Site Evaluation by:   Reviewed with Physician:    Justice Deeds 10/06/2014 1:27 AM

## 2014-10-06 NOTE — Discharge Instructions (Signed)
Narcotic Overdose  A narcotic overdose is the misuse or overuse of a narcotic drug. A narcotic overdose can make you pass out and stop breathing. If you are not treated right away, this can cause permanent brain damage or stop your heart. Medicine may be given to reverse the effects of an overdose. If so, this medicine may bring on withdrawal symptoms. The symptoms may be abdominal cramps, throwing up (vomiting), sweating, chills, and nervousness.  Injecting narcotics can cause more problems than just an overdose. AIDS, hepatitis, and other very serious infections are transmitted by sharing needles and syringes. If you decide to quit using, there are medicines which can help you through the withdrawal period. Trying to quit all at once on your own can be uncomfortable, but not life-threatening. Call your caregiver, Narcotics Anonymous, or any drug and alcohol treatment program for further help.   Document Released: 02/10/2004 Document Revised: 03/27/2011 Document Reviewed: 12/04/2008  ExitCare® Patient Information ©2015 [employer]. This information is not intended to replace advice given to you by your health care provider. Make sure you discuss any questions you have with your health care provider.

## 2014-10-06 NOTE — ED Provider Notes (Signed)
I assumed care of the patient from Dr. Shaune Pollack at 11:00 PM. Patient was evaluated by behavioral medical staff. Patient denies any suicidal ideation. Patient and his guests that were in his room very jovial at time of my evaluation. I informed the patient of all clinical findings including his x-ray results which were negative. DG Foot Complete Left (Final result) Result time: 10/06/14 01:23:12   Final result by Rad Results In Interface (10/06/14 01:23:12)   Narrative:   CLINICAL DATA: Toe pain  EXAM: LEFT FOOT - COMPLETE 3+ VIEW  COMPARISON: None.  FINDINGS: No fracture or dislocation is seen.  The joint spaces are preserved.  Visualized soft tissues are within normal limits.  IMPRESSION: No fracture or dislocation is seen.   Electronically Signed By: Charline Bills M.D. On: 10/06/2014 01:23          DG Foot Complete Right (Final result) Result time: 10/06/14 01:22:50   Final result by Rad Results In Interface (10/06/14 01:22:50)   Narrative:   CLINICAL DATA: Toe pain  EXAM: RIGHT FOOT COMPLETE - 3+ VIEW  COMPARISON: None.  FINDINGS: No fracture or dislocation is seen.  The joint spaces are preserved.  Visualized soft tissues are within normal limits.  IMPRESSION: No fracture or dislocation is seen.   Electronically Signed By: Charline Bills M.D. On: 10/06/2014 01:22     Darci Current, MD 10/06/14 [phone]

## 2015-02-08 ENCOUNTER — Encounter: Payer: Self-pay | Admitting: Emergency Medicine

## 2015-02-08 ENCOUNTER — Emergency Department
Admission: EM | Admit: 2015-02-08 | Discharge: 2015-02-08 | Disposition: A | Discharge status: Home / Self Care | Payer: Self-pay | Attending: Emergency Medicine | Admitting: Emergency Medicine

## 2015-02-08 DIAGNOSIS — Z79899 Other long term (current) drug therapy: Secondary | ICD-10-CM | POA: Insufficient documentation

## 2015-02-08 DIAGNOSIS — F17200 Nicotine dependence, unspecified, uncomplicated: Secondary | ICD-10-CM | POA: Insufficient documentation

## 2015-02-08 DIAGNOSIS — R400 Somnolence: Secondary | ICD-10-CM | POA: Insufficient documentation

## 2015-02-08 DIAGNOSIS — R079 Chest pain, unspecified: Secondary | ICD-10-CM | POA: Insufficient documentation

## 2015-02-08 DIAGNOSIS — F1110 Opioid abuse, uncomplicated: Secondary | ICD-10-CM | POA: Insufficient documentation

## 2015-02-08 NOTE — Discharge Instructions (Signed)
Opioid Use Disorder  Opioid use disorder is a mental disorder. It is the continued nonmedical use of opioids in spite of risks to health and well-being. Misused opioids include the street drug heroin. They also include pain medicines such as morphine, hydrocodone, oxycodone, and fentanyl. Opioids are very addictive. People who misuse opioids get an exaggerated feeling of well-being. Opioid use disorder often disrupts activities at home, work, or school. It may cause mental or physical problems.   A family history of opioid use disorder puts you at higher risk of it. People with opioid use disorder often misuse other drugs or have mental illness such as depression, posttraumatic stress disorder, or antisocial personality disorder. They also are at risk of suicide and death from overdose.  SIGNS AND SYMPTOMS   Signs and symptoms of opioid use disorder include:  · Use of opioids in larger amounts or over a longer period than intended.  · Unsuccessful attempts to cut down or control opioid use.  · A lot of time spent obtaining, using, or recovering from the effects of opioids.  · A strong desire or urge to use opioids (craving).  · Continued use of opioids in spite of major problems at work, school, or home because of use.  · Continued use of opioids in spite of relationship problems because of use.  · Giving up or cutting down on important life activities because of opioid use.  · Use of opioids over and over in situations when it is physically hazardous, such as driving a car.  · Continued use of opioids in spite of a physical problem that is likely related to use. Physical problems can include:  ¨ Severe constipation.  ¨ Poor nutrition.  ¨ Infertility.  ¨ Tuberculosis.  ¨ Aspiration pneumonia.  ¨ Infections such as human immunodeficiency virus (HIV) and hepatitis (from injecting opioids).  · Continued use of opioids in spite of a mental problem that is likely related to use. Mental problems can  include:  ¨ Depression.  ¨ Anxiety.  ¨ Hallucinations.  ¨ Sleep problems.  ¨ Loss of sexual function.  · Need to use more and more opioids to get the same effect, or lessened effect over time with use of the same amount (tolerance).  · Having withdrawal symptoms when opioid use is stopped, or using opioids to reduce or avoid withdrawal symptoms. Withdrawal symptoms include:  ¨ Depressed, anxious, or irritable mood.  ¨ Nausea, vomiting, diarrhea, or intestinal cramping.  ¨ Muscle aches or spasms.  ¨ Excessive tearing or runny nose.  ¨ Dilated pupils, sweating, or hairs standing on end.  ¨ Yawning.  ¨ Fever, raised blood pressure, or fast pulse.  ¨ Restlessness or trouble sleeping. This does not apply to people taking opioids for medical reasons only.  DIAGNOSIS  Opioid use disorder is diagnosed by your health care provider. You may be asked questions about your opioid use and and how it affects your life. A physical exam may be done. A drug screen may be ordered. You may be referred to a mental health professional. The diagnosis of opioid use disorder requires at least two symptoms within 12 months. The type of opioid use disorder you have depends on the number of signs and symptoms you have. The type may be:  · Mild. Two or three signs and symptoms.     · Moderate. Four or five signs and symptoms.    · Severe. Six or more signs and symptoms.  TREATMENT   Treatment is usually provided by mental   health professionals with training in substance use disorders. The following options are available:  · Detoxification. This is the first step in treatment for withdrawal. It is medically supervised withdrawal with the use of medicines. These medicines lessen withdrawal symptoms. They also raise the chance of becoming opioid free.  · Counseling, also known as talk therapy. Talk therapy addresses the reasons you use opioids. It also addresses ways to keep you from using again (relapse). The goals of talk therapy are to avoid  relapse by:  ¨ Identifying and avoiding triggers for use.  ¨ Finding healthy ways to cope with stress.  ¨ Learning how to handle cravings.  · Support groups. Support groups provide emotional support, advice, and guidance.  · A medicine that blocks opioid receptors in your brain. This medicine can reduce opioid cravings that lead to relapse. This medicine also blocks the desired opioid effect when relapse occurs.  · Opioids that are taken by mouth in place of the misused opioid (opioid maintenance treatment). These medicines satisfy cravings but are safer than commonly misused opioids. This often is the best option for people who continue to relapse with other treatments.  HOME CARE INSTRUCTIONS   · Take medicines only as directed by your health care provider.  · Check with your health care provider before starting new medicines.  · Keep all follow-up visits as directed by your health care provider.  SEEK MEDICAL CARE IF:  · You are not able to take your medicines as directed.  · Your symptoms get worse.  SEEK IMMEDIATE MEDICAL CARE IF:  · You have serious thoughts about hurting yourself or others.  · You may have taken an overdose of opioids.  FOR MORE INFORMATION  · National Institute on Drug Abuse: www.drugabuse.gov  · Substance Abuse and Mental Health Services Administration: www.samhsa.gov     This information is not intended to replace advice given to you by your health care provider. Make sure you discuss any questions you have with your health care provider.     Document Released: 10/30/2006 Document Revised: 01/23/2014 Document Reviewed: 01/15/2013  Elsevier Interactive Patient Education ©2016 [employer]

## 2015-02-08 NOTE — ED Notes (Signed)
Brought in via ems from home  Per ems he snorted a percocet and became apneic .Marland Kitchenwas given 2 mg of Narcan nasally  Alert on arrival w/o complaints

## 2015-02-08 NOTE — ED Provider Notes (Signed)
[facility] Emergency Department Provider Note  ____________________________________________  Time seen: 3:00 PM  I have reviewed the triage vital signs and the nursing notes.   HISTORY  Chief Complaint Ingestion    HPI Jason Frazier is a 45 y.o. male sent to the ED by his father after he snorted a 20 mg Percocet and subsequently became somnolent and passed out for a period of time. He is estimates that this is about 12:00 PM that he snorted the Percocet. He denies any complaints at this time. He was not trying to harm himself and denies SI or HI or hallucinations. He does report that he had an opioid use problem in the past days been clean for several years. He's been under increased stress recently due to loss of job and threat of having his utilities cut off due to nonpayment of his bills and needed an escape.     History reviewed. No pertinent past medical history. Opioid dependence in remission  There are no active problems to display for this patient.    History reviewed. No pertinent past surgical history.   Current Outpatient Rx  Name  Route  Sig  Dispense  Refill  . HYDROcodone-acetaminophen (NORCO) 5-325 MG per tablet   Oral   Take 1-2 tablets by mouth every 4 (four) hours as needed.   15 tablet   0   . ondansetron (ZOFRAN) 4 MG tablet   Oral   Take 1 tablet (4 mg total) by mouth every 6 (six) hours.   12 tablet   0   . oxyCODONE (ROXICODONE) 5 MG immediate release tablet   Oral   Take 1 tablet (5 mg total) by mouth every 6 (six) hours as needed for severe pain.   15 tablet   0   . traMADol (ULTRAM) 50 MG tablet   Oral   Take 1 tablet (50 mg total) by mouth every 6 (six) hours as needed.   10 tablet   0      Allergies Morphine and related   No family history on file.  Social History Social History  Substance Use Topics  . Smoking status: Current Every Day Smoker  . Smokeless tobacco: None  . Alcohol Use: No     Review of Systems  Constitutional:   No fever or chills. No weight changes Eyes:   No blurry vision or double vision.  ENT:   No sore throat. Cardiovascular:   Mild left anterior chest pain, nonradiating, nonexertional nonpleuritic, sharp, intermittent and fleeting,his symptoms. Respiratory:   No dyspnea or cough. Gastrointestinal:   Negative for abdominal pain, vomiting and diarrhea.  No BRBPR or melena. Genitourinary:   Negative for dysuria, urinary retention, bloody urine, or difficulty urinating. Musculoskeletal:   Negative for back pain. No joint swelling or pain. Skin:   Negative for rash. Neurological:   Negative for headaches, focal weakness or numbness. Psychiatric:  No anxiety or depression.   Endocrine:  No hot/cold intolerance, changes in energy, or sleep difficulty.  10-point ROS otherwise negative.  ____________________________________________   PHYSICAL EXAM:  VITAL SIGNS: ED Triage Vitals  Enc Vitals Group     BP 02/08/15 1402 147/83 mmHg     Pulse Rate 02/08/15 1402 105     Resp 02/08/15 1402 18     Temp 02/08/15 1402 98.2 F (36.8 C)     Temp Source 02/08/15 1402 Oral     SpO2 02/08/15 1402 98 %     Weight 02/08/15 1402  175 lb (79.379 kg)     Height 02/08/15 1402  (1.753 m)     Head Cir --      Peak Flow --      Pain Score --      Pain Loc --      Pain Edu? --      Excl. in GC? --     Vital signs reviewed, nursing assessments reviewed.   Constitutional:   Alert and oriented. Well appearing and in no distress. Eyes:   No scleral icterus. No conjunctival pallor. PERRL. EOMI ENT   Head:   Normocephalic and atraumatic.   Nose:   No congestion/rhinnorhea. No septal hematoma   Mouth/Throat:   MMM, no pharyngeal erythema. No peritonsillar mass. No uvula shift.   Neck:   No stridor. No SubQ emphysema. No meningismus. Hematological/Lymphatic/Immunilogical:   No cervical lymphadenopathy. Cardiovascular:   RRR. Normal and symmetric  distal pulses are present in all extremities. No murmurs, rubs, or gallops. Respiratory:   Normal respiratory effort without tachypnea nor retractions. Breath sounds are clear and equal bilaterally. No wheezes/rales/rhonchi. Gastrointestinal:   Soft and nontender. No distention. There is no CVA tenderness.  No rebound, rigidity, or guarding. Genitourinary:   deferred Musculoskeletal:   Nontender with normal range of motion in all extremities. No joint effusions.  No lower extremity tenderness.  No edema. Neurologic:   Normal speech and language.  CN 2-10 normal. Motor grossly intact. No pronator drift.  Normal gait. No gross focal neurologic deficits are appreciated.  Skin:    Skin is warm, dry and intact. No rash noted.  No petechiae, purpura, or bullae. Psychiatric:   Mood and affect are normal. Speech and behavior are normal. Patient exhibits appropriate insight and judgment.  ____________________________________________    LABS (pertinent positives/negatives) (all labs ordered are listed, but only abnormal results are displayed) Labs Reviewed - No data to display ____________________________________________   EKG  Interpreted by me Sinus tachycardia rate 12, normal axis intervals QRS and ST segments and T waves  ____________________________________________    RADIOLOGY    ____________________________________________   PROCEDURES   ____________________________________________   INITIAL IMPRESSION / ASSESSMENT AND PLAN / ED COURSE  Pertinent labs & imaging results that were available during my care of the patient were reviewed by me and considered in my medical decision making (see chart for details).  Patient presents due to a period of intoxication somnolence after snorting a Percocet at home. He is back to baseline and well-appearing at this time. This appears to been a pharmacologic misadventure and he was not trying to harm himself and he appears  psychiatrically stable at this time. No indication for any additional workup. I don't think the chest pain represents ACS PE TAD pneumothorax carditis mediastinitis or pneumonia.     ____________________________________________   FINAL CLINICAL IMPRESSION(S) / ED DIAGNOSES  Final diagnoses:  Opioid abuse      Sharman Cheek, MD 02/08/15 [phone]

## 2015-02-08 NOTE — ED Notes (Signed)
See triage  Pt is alert /oriented ..states he has done this in the past w/o any problems. Also  states he does not want any help with drug use

## 2017-03-30 ENCOUNTER — Emergency Department (HOSPITAL_COMMUNITY): Admission: EM | Admit: 2017-03-30 | Discharge: 2017-03-30 | Discharge status: Against Medical Advice | Payer: Self-pay

## 2017-04-04 ENCOUNTER — Emergency Department: Payer: Self-pay

## 2017-04-04 ENCOUNTER — Encounter: Payer: Self-pay | Admitting: Emergency Medicine

## 2017-04-04 ENCOUNTER — Inpatient Hospital Stay
Admission: EM | Admit: 2017-04-04 | Discharge: 2017-04-06 | DRG: 580 | Disposition: A | Discharge status: Home / Self Care | Payer: Self-pay | Attending: Internal Medicine | Admitting: Internal Medicine

## 2017-04-04 ENCOUNTER — Observation Stay: Payer: Self-pay

## 2017-04-04 ENCOUNTER — Other Ambulatory Visit: Payer: Self-pay

## 2017-04-04 DIAGNOSIS — L03113 Cellulitis of right upper limb: Secondary | ICD-10-CM | POA: Diagnosis present

## 2017-04-04 DIAGNOSIS — F1910 Other psychoactive substance abuse, uncomplicated: Secondary | ICD-10-CM | POA: Diagnosis present

## 2017-04-04 DIAGNOSIS — L02413 Cutaneous abscess of right upper limb: Secondary | ICD-10-CM | POA: Diagnosis present

## 2017-04-04 DIAGNOSIS — M65831 Other synovitis and tenosynovitis, right forearm: Secondary | ICD-10-CM | POA: Diagnosis present

## 2017-04-04 DIAGNOSIS — L02511 Cutaneous abscess of right hand: Principal | ICD-10-CM | POA: Diagnosis present

## 2017-04-04 DIAGNOSIS — Z885 Allergy status to narcotic agent status: Secondary | ICD-10-CM

## 2017-04-04 DIAGNOSIS — L0390 Cellulitis, unspecified: Secondary | ICD-10-CM | POA: Diagnosis present

## 2017-04-04 DIAGNOSIS — F1110 Opioid abuse, uncomplicated: Secondary | ICD-10-CM | POA: Diagnosis present

## 2017-04-04 DIAGNOSIS — F17210 Nicotine dependence, cigarettes, uncomplicated: Secondary | ICD-10-CM | POA: Diagnosis present

## 2017-04-04 HISTORY — DX: Other psychoactive substance use, unspecified, uncomplicated: F19.90

## 2017-04-04 LAB — CBC WITH DIFFERENTIAL/PLATELET
Basophils Absolute: 0.1 10*3/uL (ref 0–0.1)
Basophils Relative: 1 %
EOS ABS: 0.2 10*3/uL (ref 0–0.7)
EOS PCT: 1 %
HCT: 41.8 % (ref 40.0–52.0)
Hemoglobin: 14.1 g/dL (ref 13.0–18.0)
LYMPHS ABS: 3.2 10*3/uL (ref 1.0–3.6)
LYMPHS PCT: 18 %
MCH: 28 pg (ref 26.0–34.0)
MCHC: 33.8 g/dL (ref 32.0–36.0)
MCV: 82.7 fL (ref 80.0–100.0)
Monocytes Absolute: 1.4 10*3/uL — ABNORMAL HIGH (ref 0.2–1.0)
Monocytes Relative: 8 %
Neutro Abs: 12.8 10*3/uL — ABNORMAL HIGH (ref 1.4–6.5)
Neutrophils Relative %: 72 %
PLATELETS: 419 10*3/uL (ref 150–440)
RBC: 5.05 MIL/uL (ref 4.40–5.90)
RDW: 14.7 % — ABNORMAL HIGH (ref 11.5–14.5)
WBC: 17.7 10*3/uL — ABNORMAL HIGH (ref 3.8–10.6)

## 2017-04-04 LAB — COMPREHENSIVE METABOLIC PANEL
ALK PHOS: 142 U/L — AB (ref 38–126)
ALT: 157 U/L — ABNORMAL HIGH (ref 17–63)
ANION GAP: 12 (ref 5–15)
AST: 93 U/L — ABNORMAL HIGH (ref 15–41)
Albumin: 4.3 g/dL (ref 3.5–5.0)
BUN: 8 mg/dL (ref 6–20)
CALCIUM: 9.4 mg/dL (ref 8.9–10.3)
CHLORIDE: 97 mmol/L — AB (ref 101–111)
CO2: 25 mmol/L (ref 22–32)
CREATININE: 0.65 mg/dL (ref 0.61–1.24)
Glucose, Bld: 114 mg/dL — ABNORMAL HIGH (ref 65–99)
Potassium: 3.7 mmol/L (ref 3.5–5.1)
SODIUM: 134 mmol/L — AB (ref 135–145)
Total Bilirubin: 0.6 mg/dL (ref 0.3–1.2)
Total Protein: 9 g/dL — ABNORMAL HIGH (ref 6.5–8.1)

## 2017-04-04 LAB — LACTIC ACID, PLASMA
LACTIC ACID, VENOUS: 0.7 mmol/L (ref 0.5–1.9)
LACTIC ACID, VENOUS: 0.5 mmol/L (ref 0.5–1.9)

## 2017-04-04 MED ORDER — VANCOMYCIN HCL IN DEXTROSE 1-5 GM/200ML-% IV SOLN
1000.0000 mg | Freq: Once | INTRAVENOUS | Status: AC
  Administered 2017-04-04: 1000 mg via INTRAVENOUS
  Filled 2017-04-04: qty 200

## 2017-04-04 MED ORDER — ONDANSETRON HCL 4 MG PO TABS: 4.0000 mg | ORAL_TABLET | Freq: Four times a day (QID) | ORAL | Status: DC | PRN

## 2017-04-04 MED ORDER — MORPHINE SULFATE (PF) 4 MG/ML IV SOLN
4.0000 mg | Freq: Once | INTRAVENOUS | Status: AC
  Administered 2017-04-04: 4 mg via INTRAVENOUS
  Filled 2017-04-04: qty 1

## 2017-04-04 MED ORDER — ACETAMINOPHEN 325 MG PO TABS
650.0000 mg | ORAL_TABLET | Freq: Four times a day (QID) | ORAL | Status: DC | PRN
  Administered 2017-04-05: 650 mg via ORAL
  Filled 2017-04-04: qty 2

## 2017-04-04 MED ORDER — OXYCODONE HCL 5 MG PO TABS
5.0000 mg | ORAL_TABLET | Freq: Four times a day (QID) | ORAL | Status: DC | PRN
  Administered 2017-04-04 – 2017-04-05 (×3): 5 mg via ORAL
  Filled 2017-04-04 (×3): qty 1

## 2017-04-04 MED ORDER — ENOXAPARIN SODIUM 40 MG/0.4ML ~~LOC~~ SOLN: 40.0000 mg | SUBCUTANEOUS | Status: DC

## 2017-04-04 MED ORDER — VANCOMYCIN HCL 10 G IV SOLR
1250.0000 mg | Freq: Three times a day (TID) | INTRAVENOUS | Status: DC
  Administered 2017-04-04 – 2017-04-06 (×5): 1250 mg via INTRAVENOUS
  Filled 2017-04-04 (×8): qty 1250

## 2017-04-04 MED ORDER — ONDANSETRON HCL 4 MG/2ML IJ SOLN: 4.0000 mg | Freq: Four times a day (QID) | INTRAMUSCULAR | Status: DC | PRN

## 2017-04-04 MED ORDER — PIPERACILLIN-TAZOBACTAM 3.375 G IVPB
3.3750 g | Freq: Three times a day (TID) | INTRAVENOUS | Status: DC
  Administered 2017-04-04 – 2017-04-06 (×5): 3.375 g via INTRAVENOUS
  Filled 2017-04-04 (×5): qty 50

## 2017-04-04 MED ORDER — PIPERACILLIN-TAZOBACTAM 3.375 G IVPB 30 MIN
3.3750 g | Freq: Once | INTRAVENOUS | Status: AC
  Administered 2017-04-04: 3.375 g via INTRAVENOUS
  Filled 2017-04-04: qty 50

## 2017-04-04 MED ORDER — ACETAMINOPHEN 650 MG RE SUPP: 650.0000 mg | Freq: Four times a day (QID) | RECTAL | Status: DC | PRN

## 2017-04-04 MED ORDER — GADOBENATE DIMEGLUMINE 529 MG/ML IV SOLN
15.0000 mL | Freq: Once | INTRAVENOUS | Status: AC | PRN
  Administered 2017-04-04: 15 mL via INTRAVENOUS

## 2017-04-04 MED ORDER — MORPHINE SULFATE (PF) 2 MG/ML IV SOLN
2.0000 mg | INTRAVENOUS | Status: DC | PRN
  Administered 2017-04-06: 2 mg via INTRAVENOUS
  Filled 2017-04-04: qty 1

## 2017-04-04 MED ORDER — ENOXAPARIN SODIUM 40 MG/0.4ML ~~LOC~~ SOLN
40.0000 mg | SUBCUTANEOUS | Status: DC
  Administered 2017-04-04: 40 mg via SUBCUTANEOUS
  Filled 2017-04-04: qty 0.4
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
## 2017-04-04 NOTE — ED Notes (Signed)
ED Provider at bedside. 

## 2017-04-04 NOTE — Progress Notes (Signed)
Patient seen and examined.  I will order MRI to evaluate for underlying abscess and orthopedic surgery consultation.  Tobacco dependence: Patient is encouraged to quit smoking. Counseling was provided for 4 minutes. He does not want nicotine patch at this time

## 2017-04-04 NOTE — Progress Notes (Signed)
Pharmacy Antibiotic Note  FORCEat C Paster is a 47 y.o. male admitted on 04/04/2017 with cellulitis.  Pharmacy has been consulted for vancomycin and Zosyn dosing.  Plan: Zosyn 3.375g IV q8h (4 hour infusion).   DW 82kg  Vd 57L kei 0.1 hr-1  T1/2 7 hours Vancomycin 1250 mg q 8 hours ordered with stacked dosing. Level before 5th dose. Goal trough 15-20.  Height: 5\' 9"  (175.3 cm) Weight: 180 lb (81.6 kg) IBW/kg (Calculated) : 70.7  Temp (24hrs), Avg:98.4 F (36.9 C), Min:98.4 F (36.9 C), Max:98.4 F (36.9 C)  Recent Labs  Lab 04/04/17 0242  WBC 17.7*  CREATININE 0.65  LATICACIDVEN 0.7    Estimated Creatinine Clearance: 117.8 mL/min (by C-G formula based on SCr of 0.65 mg/dL).    Allergies  Allergen Reactions  . Morphine And Related Hives    Antimicrobials this admission: Vancomycin, Zosyn 3/20  >>    >>   Dose adjustments this admission:   Microbiology results: 3/20 BCx: pending   Thank you for allowing pharmacy to be a part of this patient's care.  Stephano Arrants S 04/04/2017 6:25 AM

## 2017-04-04 NOTE — ED Notes (Signed)
Pt in reporting 8/10(R) hand pain/pressure and swelling that began 10 days ago after injecting heroin. Pain is worse with movement and pt reports no alleviating factors.
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
## 2017-04-05 ENCOUNTER — Encounter
Admission: EM | Disposition: A | Discharge status: Home / Self Care | Payer: Self-pay | Source: Home / Self Care | Attending: Internal Medicine

## 2017-04-05 ENCOUNTER — Inpatient Hospital Stay: Payer: Self-pay | Admitting: Anesthesiology

## 2017-04-05 HISTORY — PX: I & D EXTREMITY: SHX5045

## 2017-04-05 LAB — URINE DRUG SCREEN, QUALITATIVE (ARMC ONLY)
AMPHETAMINES, UR SCREEN: POSITIVE — AB
Barbiturates, Ur Screen: NOT DETECTED
Benzodiazepine, Ur Scrn: NOT DETECTED
COCAINE METABOLITE, UR ~~LOC~~: NOT DETECTED
Cannabinoid 50 Ng, Ur ~~LOC~~: POSITIVE — AB
MDMA (ECSTASY) UR SCREEN: NOT DETECTED
Methadone Scn, Ur: NOT DETECTED
OPIATE, UR SCREEN: POSITIVE — AB
Phencyclidine (PCP) Ur S: NOT DETECTED
TRICYCLIC, UR SCREEN: NOT DETECTED

## 2017-04-05 LAB — VANCOMYCIN, TROUGH: Vancomycin Tr: 15 ug/mL (ref 15–20)

## 2017-04-05 SURGERY — IRRIGATION AND DEBRIDEMENT EXTREMITY
Anesthesia: General | Laterality: Right

## 2017-04-05 MED ORDER — LIDOCAINE HCL (PF) 2 % IJ SOLN
INTRAMUSCULAR | Status: AC
  Filled 2017-04-05: qty 10

## 2017-04-05 MED ORDER — OXYCODONE HCL 5 MG PO TABS: 5.0000 mg | ORAL_TABLET | Freq: Once | ORAL | Status: DC | PRN

## 2017-04-05 MED ORDER — MIDAZOLAM HCL 2 MG/2ML IJ SOLN
INTRAMUSCULAR | Status: AC
  Filled 2017-04-05: qty 2

## 2017-04-05 MED ORDER — DEXAMETHASONE SODIUM PHOSPHATE 10 MG/ML IJ SOLN
INTRAMUSCULAR | Status: DC | PRN
  Administered 2017-04-05: 10 mg via INTRAVENOUS

## 2017-04-05 MED ORDER — PROMETHAZINE HCL 25 MG/ML IJ SOLN: 6.2500 mg | INTRAMUSCULAR | Status: DC | PRN

## 2017-04-05 MED ORDER — PROPOFOL 500 MG/50ML IV EMUL
INTRAVENOUS | Status: DC | PRN
  Administered 2017-04-05: 150 ug/kg/min via INTRAVENOUS

## 2017-04-05 MED ORDER — FENTANYL CITRATE (PF) 100 MCG/2ML IJ SOLN
INTRAMUSCULAR | Status: DC | PRN
  Administered 2017-04-05: 50 ug via INTRAVENOUS
  Administered 2017-04-05 (×2): 25 ug via INTRAVENOUS

## 2017-04-05 MED ORDER — FENTANYL CITRATE (PF) 100 MCG/2ML IJ SOLN
INTRAMUSCULAR | Status: AC
Start: 2017-04-05 — End: 2017-04-05
  Administered 2017-04-05: 50 ug via INTRAVENOUS
  Filled 2017-04-05: qty 2

## 2017-04-05 MED ORDER — OXYCODONE HCL 5 MG/5ML PO SOLN: 5.0000 mg | Freq: Once | ORAL | Status: DC | PRN

## 2017-04-05 MED ORDER — ONDANSETRON HCL 4 MG/2ML IJ SOLN
INTRAMUSCULAR | Status: AC
  Filled 2017-04-05: qty 2

## 2017-04-05 MED ORDER — LACTATED RINGERS IV SOLN
INTRAVENOUS | Status: DC | PRN
Start: 2017-04-05 — End: 2017-04-05
  Administered 2017-04-05: 23:00:00 via INTRAVENOUS

## 2017-04-05 MED ORDER — FENTANYL CITRATE (PF) 100 MCG/2ML IJ SOLN
INTRAMUSCULAR | Status: AC
  Administered 2017-04-05: 50 ug via INTRAVENOUS
  Filled 2017-04-05: qty 2

## 2017-04-05 MED ORDER — FENTANYL CITRATE (PF) 100 MCG/2ML IJ SOLN
25.0000 ug | INTRAMUSCULAR | Status: DC | PRN
  Administered 2017-04-05 – 2017-04-06 (×4): 50 ug via INTRAVENOUS

## 2017-04-05 MED ORDER — PROPOFOL 500 MG/50ML IV EMUL
INTRAVENOUS | Status: AC
  Filled 2017-04-05: qty 50

## 2017-04-05 MED ORDER — MEPERIDINE HCL 50 MG/ML IJ SOLN: 6.2500 mg | INTRAMUSCULAR | Status: DC | PRN

## 2017-04-05 MED ORDER — SODIUM CHLORIDE 0.9 % IJ SOLN
INTRAMUSCULAR | Status: AC
  Filled 2017-04-05: qty 10

## 2017-04-05 MED ORDER — DEXAMETHASONE SODIUM PHOSPHATE 10 MG/ML IJ SOLN
INTRAMUSCULAR | Status: AC
Start: 2017-04-05 — End: ?
  Filled 2017-04-05: qty 1

## 2017-04-05 MED ORDER — SODIUM CHLORIDE 0.9 % IR SOLN
Status: DC | PRN
  Administered 2017-04-05: 850 mL

## 2017-04-05 MED ORDER — MIDAZOLAM HCL 2 MG/2ML IJ SOLN
INTRAMUSCULAR | Status: DC | PRN
  Administered 2017-04-05: 2 mg via INTRAVENOUS

## 2017-04-05 MED ORDER — BACITRACIN 50000 UNITS IM SOLR
INTRAMUSCULAR | Status: AC
  Filled 2017-04-05: qty 1

## 2017-04-05 MED ORDER — LIDOCAINE HCL (CARDIAC) 20 MG/ML IV SOLN
INTRAVENOUS | Status: DC | PRN
Start: 2017-04-05 — End: 2017-04-05
  Administered 2017-04-05: 100 mg via INTRAVENOUS

## 2017-04-05 MED ORDER — ONDANSETRON HCL 4 MG/2ML IJ SOLN
INTRAMUSCULAR | Status: DC | PRN
  Administered 2017-04-05: 4 mg via INTRAVENOUS

## 2017-04-05 MED ORDER — PROPOFOL 10 MG/ML IV BOLUS
INTRAVENOUS | Status: AC
  Filled 2017-04-05: qty 40

## 2017-04-05 MED ORDER — FENTANYL CITRATE (PF) 100 MCG/2ML IJ SOLN
INTRAMUSCULAR | Status: AC
  Filled 2017-04-05: qty 2

## 2017-04-05 MED ORDER — PROPOFOL 10 MG/ML IV BOLUS
INTRAVENOUS | Status: DC | PRN
  Administered 2017-04-05: 200 mg via INTRAVENOUS

## 2017-04-05 SURGICAL SUPPLY — 48 items
BANDAGE ACE 3X5.8 VEL STRL LF (GAUZE/BANDAGES/DRESSINGS) ×3 IMPLANT
BNDG COHESIVE 4X5 TAN STRL (GAUZE/BANDAGES/DRESSINGS) IMPLANT
BNDG GAUZE 4.5X4.1 6PLY STRL (MISCELLANEOUS) IMPLANT
BRUSH SCRUB EZ  4% CHG (MISCELLANEOUS) ×2
BRUSH SCRUB EZ 4% CHG (MISCELLANEOUS) ×1 IMPLANT
CANISTER SUCT 1200ML W/VALVE (MISCELLANEOUS) ×3 IMPLANT
CANISTER SUCT 3000ML PPV (MISCELLANEOUS) IMPLANT
CAST PADDING 3X4FT ST 30246 (SOFTGOODS) ×2
CHLORAPREP W/TINT 26ML (MISCELLANEOUS) ×3 IMPLANT
CUFF TOURN 18 STER (MISCELLANEOUS) ×3 IMPLANT
DRAIN PENROSE 1/4X12 LTX (DRAIN) ×3 IMPLANT
DRAPE INCISE IOBAN 66X60 STRL (DRAPES) ×3 IMPLANT
DRAPE SHEET LG 3/4 BI-LAMINATE (DRAPES) IMPLANT
DRAPE SURG 17X11 SM STRL (DRAPES) IMPLANT
DRAPE U-SHAPE 47X51 STRL (DRAPES) IMPLANT
ELECT REM PT RETURN 9FT ADLT (ELECTROSURGICAL) ×3
ELECTRODE REM PT RTRN 9FT ADLT (ELECTROSURGICAL) ×1 IMPLANT
GAUZE PETRO XEROFOAM 1X8 (MISCELLANEOUS) ×3 IMPLANT
GAUZE SPONGE 4X4 12PLY STRL (GAUZE/BANDAGES/DRESSINGS) ×3 IMPLANT
GAUZE XEROFORM 4X4 STRL (GAUZE/BANDAGES/DRESSINGS) ×3 IMPLANT
GLOVE INDICATOR 8.0 STRL GRN (GLOVE) ×6 IMPLANT
GLOVE SURG ORTHO 8.0 STRL STRW (GLOVE) ×6 IMPLANT
GOWN STRL REUS W/ TWL LRG LVL3 (GOWN DISPOSABLE) ×2 IMPLANT
GOWN STRL REUS W/ TWL XL LVL3 (GOWN DISPOSABLE) IMPLANT
GOWN STRL REUS W/TWL LRG LVL3 (GOWN DISPOSABLE) ×4
GOWN STRL REUS W/TWL XL LVL3 (GOWN DISPOSABLE)
KIT TURNOVER KIT A (KITS) ×3 IMPLANT
NEEDLE FILTER BLUNT 18X 1/2SAF (NEEDLE) ×2
NEEDLE FILTER BLUNT 18X1 1/2 (NEEDLE) ×1 IMPLANT
NS IRRIG 1000ML POUR BTL (IV SOLUTION) ×3 IMPLANT
PACK EXTREMITY ARMC (MISCELLANEOUS) ×3 IMPLANT
PAD CAST CTTN 3X4 STRL (SOFTGOODS) ×1 IMPLANT
STAPLER SKIN PROX 35W (STAPLE) ×3 IMPLANT
STOCKINETTE IMPERVIOUS 9X36 MD (GAUZE/BANDAGES/DRESSINGS) ×3 IMPLANT
SUT ETHILON 2 0 FSLX (SUTURE) IMPLANT
SUT ETHILON 4-0 (SUTURE) ×2
SUT ETHILON 4-0 FS2 18XMFL BLK (SUTURE) ×1
SUT ETHILON NAB PS2 4-0 18IN (SUTURE) IMPLANT
SUT PDS AB 2-0 CT1 27 (SUTURE) IMPLANT
SUT VIC AB 1 CT1 36 (SUTURE) IMPLANT
SUT VIC AB 2-0 CT1 27 (SUTURE)
SUT VIC AB 2-0 CT1 TAPERPNT 27 (SUTURE) IMPLANT
SUT VICRYL AB 3-0 FS1 BRD 27IN (SUTURE) IMPLANT
SUTURE ETHLN 4-0 FS2 18XMF BLK (SUTURE) ×1 IMPLANT
SWAB CULTURE AMIES ANAERIB BLU (MISCELLANEOUS) ×3 IMPLANT
SYR 20CC LL (SYRINGE) ×3 IMPLANT
SYR 30ML LL (SYRINGE) ×3 IMPLANT
TOWEL OR 17X26 4PK STRL BLUE (TOWEL DISPOSABLE) ×3 IMPLANT

## 2017-04-05 NOTE — Progress Notes (Signed)
Pharmacy Antibiotic Note  Jason Frazier is a 47 y.o. male admitted on 04/04/2017 with cellulitis.  Pharmacy has been consulted for vancomycin and Zosyn dosing.  Plan: 04/05/17 13:45 vancomycin trough returned 15 mcg/mL. Continue current regimen. Pharmacy will continue to monitor and adjust as needed to maintain trough 15 to 20 mcg/mL.   Height: 5\' 9"  (175.3 cm) Weight: 180 lb (81.6 kg) IBW/kg (Calculated) : 70.7  Temp (24hrs), Avg:98.4 F (36.9 C), Min:98.1 F (36.7 C), Max:98.7 F (37.1 C)  Recent Labs  Lab 04/04/17 0242 04/04/17 0732 04/05/17 1345  WBC 17.7*  --   --   CREATININE 0.65  --   --   LATICACIDVEN 0.7 0.5  --   VANCOTROUGH  --   --  15    Estimated Creatinine Clearance: 117.8 mL/min (by C-G formula based on SCr of 0.65 mg/dL).    Allergies  Allergen Reactions  . Morphine And Related Hives    Antimicrobials this admission: Vancomycin, Zosyn 3/20  >>    >>   Dose adjustments this admission:   Microbiology results: 3/20 BCx: pending   Thank you for allowing pharmacy to be a part of this patient's care.  Carola FrostNathan A Anberlin Diez, Pharm.D., BCPS Clinical Pharmacist 04/05/2017 2:17 PM

## 2017-04-05 NOTE — Transfer of Care (Signed)
Immediate Anesthesia Transfer of Care Note  Patient: Jason Frazier  Procedure(s) Performed: IRRIGATION AND DEBRIDEMENT EXTREMITY (Right )  Patient Location: PACU  Anesthesia Type:General  Level of Consciousness: awake  Airway & Oxygen Therapy: Patient connected to face mask oxygen  Post-op Assessment: Post -op Vital signs reviewed and stable  Post vital signs: stable  Last Vitals:  Vitals Value Taken Time  BP 117/68 04/05/2017 11:33 PM  Temp 36.2 C 04/05/2017 11:33 PM  Pulse 72 04/05/2017 11:33 PM  Resp 21 04/05/2017 11:33 PM  SpO2 100 % 04/05/2017 11:33 PM  Vitals shown include unvalidated device data.  Last Pain:  Vitals:   04/05/17 2041  TempSrc:   PainSc: 8       Patients Stated Pain Goal: 0 (04/04/17 0501)  Complications: No apparent anesthesia complications

## 2017-04-05 NOTE — Anesthesia Preprocedure Evaluation (Signed)
Anesthesia Evaluation  Patient identified by MRN, date of birth, ID band Patient awake    Reviewed: Allergy & Precautions, NPO status , Patient's Chart, lab work & pertinent test results  History of Anesthesia Complications (+) history of anesthetic complications (pt reports that they "had trouble bringing him back" after surgery on salivary gland, no other surgeries)  Airway Mallampati: II  TM Distance: >3 FB Neck ROM: Full    Dental  (+) Poor Dentition, Missing   Pulmonary neg sleep apnea, neg COPD, Current Smoker,    breath sounds clear to auscultation- rhonchi (-) wheezing      Cardiovascular Exercise Tolerance: Good (-) hypertension(-) CAD, (-) Past MI, (-) Cardiac Stents and (-) CABG  Rhythm:Regular Rate:Normal - Systolic murmurs and - Diastolic murmurs    Neuro/Psych negative neurological ROS  negative psych ROS   GI/Hepatic negative GI ROS, Neg liver ROS,   Endo/Other  negative endocrine ROSneg diabetes  Renal/GU negative Renal ROS     Musculoskeletal R hand infection    Abdominal (+) - obese,   Peds  Hematology negative hematology ROS (+)   Anesthesia Other Findings Past Medical History: No date: IV drug user   Reproductive/Obstetrics                             Anesthesia Physical Anesthesia Plan  ASA: II  Anesthesia Plan: General   Post-op Pain Management:    Induction: Intravenous  PONV Risk Score and Plan: 0 and Propofol infusion and TIVA  Airway Management Planned: LMA  Additional Equipment:   Intra-op Plan:   Post-operative Plan:   Informed Consent: I have reviewed the patients History and Physical, chart, labs and discussed the procedure including the risks, benefits and alternatives for the proposed anesthesia with the patient or authorized representative who has indicated his/her understanding and acceptance.   Dental advisory given  Plan Discussed  with: CRNA and Anesthesiologist  Anesthesia Plan Comments:         Anesthesia Quick Evaluation
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
## 2017-04-05 NOTE — Op Note (Signed)
04/05/2017  11:36 PM  PATIENT:  Jason NielsenPat C Leedy    PRE-OPERATIVE DIAGNOSIS: RIGHT hand abscess  POST-OPERATIVE DIAGNOSIS: RIGHT wrist deep abscess and extensor tendon tenosynovitis  PROCEDURE:  RIGHT WRIST AND HAND IRRIGATION AND DEBRIDEMENT   SURGEON: Dola ArgyleJames R. Odis LusterBowers, MD    ANESTHESIA:   General  TOURNIQUET TIME: 10   MIN  DRAIN: 1/4 inch penrose through dorsal incision  PREOPERATIVE INDICATIONS:  [name] is a  47 y.o. male with a diagnosis of right hand and wrist abscess that elected to proceed with urgent surgical management.    The risks benefits and alternatives were discussed with the patient preoperatively including but not limited to the risks of infection, bleeding, nerve injury, incomplete relief of symptoms, pillar pain, cardiopulmonary complications, the need for revision surgery, among others, and the patient was willing to proceed.  OPERATIVE FINDINGS: ARMATOFrank pus in the dorsal compartment and subcutaneous tissues of the right wrist and dorsal hand  OPERATIVE PROCEDURE: The patient is brought to the operating room placed in the supine position. General anesthesia was administered. The right upper extremity was prepped and draped in usual sterile fashion. Time out was performed. The arm was elevated and the tourniquet was inflated. Incision was made in line with the dorsal wrist crease. Care was taken to protect the extensor tendons and neurovascular structures. The dorsal compartment was opened bluntly and cultures were obtained. Approximately 15 mL of pus was evacuated. The wound was irrigated copiously and the skin closed with nylon. A penrose drain was left within the 4th extensor compartment.  Tourniquet was deflated with good return of blood flow to all fingers. Sponge and needle counts were correct.  The patient tolerated this well, with no complications. The patient was awakened and taken to recovery in good condition.

## 2017-04-05 NOTE — Anesthesia Post-op Follow-up Note (Signed)
Anesthesia QCDR form completed.        
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
## 2017-04-05 NOTE — Anesthesia Procedure Notes (Signed)
Procedure Name: LMA Insertion Date/Time: 04/05/2017 10:44 PM Performed by: Irving BurtonBachich, Jaaliyah Lucatero, CRNA Pre-anesthesia Checklist: Patient identified, Patient being monitored, Timeout performed, Emergency Drugs available and Suction available Patient Re-evaluated:Patient Re-evaluated prior to induction Oxygen Delivery Method: Circle system utilized Preoxygenation: Pre-oxygenation with 100% oxygen Induction Type: IV induction Ventilation: Mask ventilation without difficulty LMA: LMA inserted LMA Size: 4.0 Tube type: Oral Number of attempts: 1 Placement Confirmation: positive ETCO2 and breath sounds checked- equal and bilateral Tube secured with: Tape Dental Injury: Teeth and Oropharynx as per pre-operative assessment

## 2017-04-05 NOTE — Progress Notes (Signed)
Dr. Odis LusterBowers notified of suspected drug use in room. Syringe cap noted in toilet in room. Urine drug screen positive for opiates but pt has also been taking percocet. RN supervisor notified.

## 2017-04-06 ENCOUNTER — Encounter: Payer: Self-pay | Admitting: Orthopedic Surgery

## 2017-04-06 LAB — CBC
HCT: 37.6 % — ABNORMAL LOW (ref 40.0–52.0)
Hemoglobin: 12.6 g/dL — ABNORMAL LOW (ref 13.0–18.0)
MCH: 27.6 pg (ref 26.0–34.0)
MCHC: 33.5 g/dL (ref 32.0–36.0)
MCV: 82.4 fL (ref 80.0–100.0)
PLATELETS: 407 10*3/uL (ref 150–440)
RBC: 4.57 MIL/uL (ref 4.40–5.90)
RDW: 14.6 % — AB (ref 11.5–14.5)
WBC: 10.6 10*3/uL (ref 3.8–10.6)

## 2017-04-06 LAB — HIV ANTIBODY (ROUTINE TESTING W REFLEX): HIV Screen 4th Generation wRfx: NONREACTIVE

## 2017-04-06 MED ORDER — METOCLOPRAMIDE HCL 5 MG PO TABS: 5.0000 mg | ORAL_TABLET | Freq: Three times a day (TID) | ORAL | Status: DC | PRN

## 2017-04-06 MED ORDER — METOCLOPRAMIDE HCL 5 MG/ML IJ SOLN: 5.0000 mg | Freq: Three times a day (TID) | INTRAMUSCULAR | Status: DC | PRN

## 2017-04-06 MED ORDER — HYDROCODONE-ACETAMINOPHEN 5-325 MG PO TABS: 1.0000 | ORAL_TABLET | ORAL | 0 refills | Status: AC | PRN

## 2017-04-06 MED ORDER — LACTATED RINGERS IV SOLN
INTRAVENOUS | Status: DC
  Administered 2017-04-06: 01:00:00 via INTRAVENOUS

## 2017-04-06 MED ORDER — DOCUSATE SODIUM 100 MG PO CAPS
100.0000 mg | ORAL_CAPSULE | Freq: Two times a day (BID) | ORAL | Status: DC
  Administered 2017-04-06: 100 mg via ORAL
  Filled 2017-04-06: qty 1

## 2017-04-06 MED ORDER — SULFAMETHOXAZOLE-TRIMETHOPRIM 800-160 MG PO TABS: 1.0000 | ORAL_TABLET | Freq: Two times a day (BID) | ORAL | 0 refills | Status: DC

## 2017-04-06 MED ORDER — HYDROCODONE-ACETAMINOPHEN 5-325 MG PO TABS: 1.0000 | ORAL_TABLET | ORAL | 0 refills | Status: DC | PRN

## 2017-04-06 MED ORDER — FENTANYL CITRATE (PF) 100 MCG/2ML IJ SOLN: 50.0000 ug | Freq: Once | INTRAMUSCULAR | Status: DC

## 2017-04-06 NOTE — Progress Notes (Signed)
Patient discharged to home. AVS printed. Instructions given. IV removed.

## 2017-04-06 NOTE — Anesthesia Postprocedure Evaluation (Signed)
Anesthesia Post Note  Patient: CRUEYat C Justin  Procedure(s) Performed: IRRIGATION AND DEBRIDEMENT EXTREMITY (Right )  Patient location during evaluation: PACU Anesthesia Type: General Level of consciousness: awake and alert and oriented Pain management: pain level controlled Vital Signs Assessment: post-procedure vital signs reviewed and stable Respiratory status: spontaneous breathing, nonlabored ventilation and respiratory function stable Cardiovascular status: blood pressure returned to baseline and stable Postop Assessment: no signs of nausea or vomiting Anesthetic complications: no     Last Vitals:  Vitals Value Taken Time  BP    Temp    Pulse    Resp    SpO2      Last Pain:  Vitals:   04/06/17 0040  TempSrc: Oral  PainSc:                  Janit Cutter

## 2017-04-06 NOTE — Care Management (Signed)
Patient to discharge today.  Patient provided with coupon for bactrim.  Out of pocket cost $8.63.  Patient provided application to Medication Management , and [facility]

## 2017-04-09 LAB — CULTURE, BLOOD (ROUTINE X 2)
CULTURE: NO GROWTH
Culture: NO GROWTH
Special Requests: ADEQUATE

## 2017-04-11 LAB — AEROBIC/ANAEROBIC CULTURE W GRAM STAIN (SURGICAL/DEEP WOUND)

## 2017-04-11 LAB — AEROBIC/ANAEROBIC CULTURE (SURGICAL/DEEP WOUND)
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
## 2017-06-12 ENCOUNTER — Encounter: Payer: Self-pay | Admitting: Emergency Medicine

## 2017-06-12 ENCOUNTER — Emergency Department
Admission: EM | Admit: 2017-06-12 | Discharge: 2017-06-12 | Disposition: A | Discharge status: Home / Self Care | Payer: Self-pay | Attending: Emergency Medicine | Admitting: Emergency Medicine

## 2017-06-12 ENCOUNTER — Emergency Department: Payer: Self-pay

## 2017-06-12 ENCOUNTER — Other Ambulatory Visit: Payer: Self-pay

## 2017-06-12 DIAGNOSIS — F17200 Nicotine dependence, unspecified, uncomplicated: Secondary | ICD-10-CM | POA: Insufficient documentation

## 2017-06-12 DIAGNOSIS — L03211 Cellulitis of face: Secondary | ICD-10-CM | POA: Insufficient documentation

## 2017-06-12 DIAGNOSIS — R220 Localized swelling, mass and lump, head: Secondary | ICD-10-CM

## 2017-06-12 LAB — COMPREHENSIVE METABOLIC PANEL
ALBUMIN: 4.5 g/dL (ref 3.5–5.0)
ALK PHOS: 116 U/L (ref 38–126)
ALT: 50 U/L (ref 17–63)
ANION GAP: 12 (ref 5–15)
AST: 39 U/L (ref 15–41)
BILIRUBIN TOTAL: 0.6 mg/dL (ref 0.3–1.2)
BUN: 10 mg/dL (ref 6–20)
CALCIUM: 9.9 mg/dL (ref 8.9–10.3)
CO2: 28 mmol/L (ref 22–32)
Chloride: 98 mmol/L — ABNORMAL LOW (ref 101–111)
Creatinine, Ser: 0.77 mg/dL (ref 0.61–1.24)
GFR calc Af Amer: >60 mL/min (ref >60)
GFR calc non Af Amer: >60 mL/min (ref >60)
GLUCOSE: 112 mg/dL — AB (ref 65–99)
Potassium: 3.7 mmol/L (ref 3.5–5.1)
Sodium: 138 mmol/L (ref 135–145)
TOTAL PROTEIN: 9.4 g/dL — AB (ref 6.5–8.1)

## 2017-06-12 LAB — CBC WITH DIFFERENTIAL/PLATELET
Basophils Absolute: 0.1 10*3/uL (ref 0–0.1)
Basophils Relative: 1 %
Eosinophils Absolute: 0.5 10*3/uL (ref 0–0.7)
Eosinophils Relative: 2 %
HEMATOCRIT: 43 % (ref 40.0–52.0)
HEMOGLOBIN: 14.5 g/dL (ref 13.0–18.0)
Lymphocytes Relative: 18 %
Lymphs Abs: 3.8 10*3/uL — ABNORMAL HIGH (ref 1.0–3.6)
MCH: 28.3 pg (ref 26.0–34.0)
MCHC: 33.8 g/dL (ref 32.0–36.0)
MCV: 83.6 fL (ref 80.0–100.0)
Monocytes Absolute: 1.6 10*3/uL — ABNORMAL HIGH (ref 0.2–1.0)
Monocytes Relative: 8 %
Neutro Abs: 15.1 10*3/uL — ABNORMAL HIGH (ref 1.4–6.5)
Neutrophils Relative %: 71 %
Platelets: 327 10*3/uL (ref 150–440)
RBC: 5.14 MIL/uL (ref 4.40–5.90)
RDW: 15 % — ABNORMAL HIGH (ref 11.5–14.5)
WBC: 21.1 10*3/uL — ABNORMAL HIGH (ref 3.8–10.6)

## 2017-06-12 LAB — LACTIC ACID, PLASMA: LACTIC ACID, VENOUS: 1 mmol/L (ref 0.5–1.9)

## 2017-06-12 MED ORDER — CEPHALEXIN 500 MG PO CAPS
500.0000 mg | ORAL_CAPSULE | Freq: Once | ORAL | Status: AC
  Administered 2017-06-12: 500 mg via ORAL

## 2017-06-12 MED ORDER — IOPAMIDOL (ISOVUE-370) INJECTION 76%
60.0000 mL | Freq: Once | INTRAVENOUS | Status: AC | PRN
  Administered 2017-06-12: 60 mL via INTRAVENOUS

## 2017-06-12 MED ORDER — DEXAMETHASONE SODIUM PHOSPHATE 10 MG/ML IJ SOLN
10.0000 mg | Freq: Once | INTRAMUSCULAR | Status: AC
  Administered 2017-06-12: 10 mg via INTRAVENOUS

## 2017-06-12 MED ORDER — SULFAMETHOXAZOLE-TRIMETHOPRIM 800-160 MG PO TABS
2.0000 | ORAL_TABLET | Freq: Once | ORAL | Status: AC
  Administered 2017-06-12: 2 via ORAL

## 2017-06-12 MED ORDER — SULFAMETHOXAZOLE-TRIMETHOPRIM 800-160 MG PO TABS
ORAL_TABLET | ORAL | Status: AC
  Administered 2017-06-12: 2 via ORAL
  Filled 2017-06-12: qty 2

## 2017-06-12 MED ORDER — CEPHALEXIN 500 MG PO CAPS: 500.0000 mg | ORAL_CAPSULE | Freq: Four times a day (QID) | ORAL | 0 refills | Status: AC

## 2017-06-12 MED ORDER — DEXAMETHASONE SODIUM PHOSPHATE 10 MG/ML IJ SOLN
INTRAMUSCULAR | Status: AC
Start: 2017-06-12 — End: 2017-06-12
  Administered 2017-06-12: 10 mg via INTRAVENOUS
  Filled 2017-06-12: qty 1

## 2017-06-12 MED ORDER — CEPHALEXIN 500 MG PO CAPS
ORAL_CAPSULE | ORAL | Status: AC
  Administered 2017-06-12: 500 mg via ORAL
  Filled 2017-06-12: qty 1

## 2017-06-12 MED ORDER — SULFAMETHOXAZOLE-TRIMETHOPRIM 800-160 MG PO TABS: 2.0000 | ORAL_TABLET | Freq: Two times a day (BID) | ORAL | 0 refills | Status: AC

## 2017-06-12 NOTE — ED Provider Notes (Signed)
[facility] Emergency Department Provider Note  ____________________________________________   First MD Initiated Contact with Patient 06/12/17 1954     (approximate)  I have reviewed the triage vital signs and the nursing notes.   HISTORY  Chief Complaint Facial Swelling    HPI Jason Frazier is a 47 y.o. male who presents for evaluation of gradual onset but acute worsening of left-sided facial swelling, redness, and pain.  He reports that he had a fall a few days ago and had a small laceration on his right cheek and right next to his mouth on the left side.  It seemed to be healing okay but then developed some redness over the last couple of days.  He took a friend's erythromycin and he thought it was getting better but when he woke up this morning it was severely swollen and painful.  He can feel it through the inside of his mouth and he also feels like he has some swelling below his left jaw.  He has no difficulty swallowing or speaking and his voice is not hoarse.  He does not have a sore throat.  He denies fever/chills, chest pain, shortness of breath, nausea, vomiting, and abdominal pain.  Touching the area makes it worse and nothing in particular makes it better.  He reports that his last tetanus vaccination was within the last 5 years.  Past Medical History:  Diagnosis Date  . IV drug user     Patient Active Problem List   Diagnosis Date Noted  . Cellulitis 04/04/2017    Past Surgical History:  Procedure Laterality Date  . I&D EXTREMITY Right 04/05/2017   Procedure: IRRIGATION AND DEBRIDEMENT EXTREMITY;  Surgeon: Lyndle Herrlich, MD;  Location: [facility] ORS;  Service: Orthopedics;  Laterality: Right;  . NO PAST SURGERIES      Prior to Admission medications   Medication Sig Start Date End Date Taking? Authorizing Provider  cephALEXin (KEFLEX) 500 MG capsule Take 1 capsule (500 mg total) by mouth 4 (four) times daily. 06/12/17   Loleta Rose, MD    HYDROcodone-acetaminophen (NORCO) 5-325 MG tablet Take 1-2 tablets by mouth every 4 (four) hours as needed. 04/06/17   Katha Hamming, MD  sulfamethoxazole-trimethoprim (BACTRIM DS,SEPTRA DS) 800-160 MG tablet Take 2 tablets by mouth 2 (two) times daily. 06/12/17   Loleta Rose, MD    Allergies Morphine and related  History reviewed. No pertinent family history.  Social History Social History   Tobacco Use  . Smoking status: Current Every Day Smoker  . Smokeless tobacco: Never Used  Substance Use Topics  . Alcohol use: No  . Drug use: Yes    Types: IV    Comment: heroin    Review of Systems Constitutional: No fever/chills Eyes: No visual changes. ENT: No sore throat. Cardiovascular: Denies chest pain. Respiratory: Denies shortness of breath. Gastrointestinal: No abdominal pain.  No nausea, no vomiting.  No diarrhea.  No constipation. Genitourinary: Negative for dysuria. Musculoskeletal: Negative for neck pain.  Negative for back pain. Integumentary: Swelling and redness of the left side of his face/cheek as described above Neurological: Negative for headaches, focal weakness or numbness.   ____________________________________________   PHYSICAL EXAM:  VITAL SIGNS: ED Triage Vitals  Enc Vitals Group     BP 06/12/17 1619 115/75     Pulse Rate 06/12/17 1619 85     Resp 06/12/17 1619 18     Temp 06/12/17 1619 98.7 F (37.1 C)     Temp Source  06/12/17 1619 Oral     SpO2 06/12/17 1619 100 %     Weight 06/12/17 1621 81.6 kg (180 lb)     Height 06/12/17 1621 1.753 m ( )     Head Circumference --      Peak Flow --      Pain Score 06/12/17 1621 10     Pain Loc --      Pain Edu? --      Excl. in GC? --     Constitutional: Alert and oriented. Well appearing and in no acute distress although he does appear uncomfortable Eyes: Conjunctivae are normal.  Head: Atraumatic.  See skin exam for more details about the facial infection Nose: No  congestion/rhinnorhea. Mouth/Throat: Mucous membranes are moist.  Chronically poor dentition. Neck: No stridor.  No meningeal signs.   Cardiovascular: Normal rate, regular rhythm. Good peripheral circulation. Grossly normal heart sounds. Respiratory: Normal respiratory effort.  No retractions. Lungs CTAB. Gastrointestinal: Soft and nontender. No distention.  Musculoskeletal: No lower extremity tenderness nor edema. No gross deformities of extremities. Neurologic:  Normal speech and language. No gross focal neurologic deficits are appreciated.  Skin:  Skin is warm and dry.  He has erythema and swelling as well as a little bit of crusting discharge just lateral to the left side of his mouth with a significant amount of swelling and several centimeters of induration palpable both externally and through the buccal mucosa.  He also has some firm submandibular induration below the left mandible.  No airway compromise, no evidence of peritonsillar abscess.   ____________________________________________   LABS (all labs ordered are listed, but only abnormal results are displayed)  Labs Reviewed  COMPREHENSIVE METABOLIC PANEL - Abnormal; Notable for the following components:      Result Value   Chloride 98 (*)    Glucose, Bld 112 (*)    Total Protein 9.4 (*)    All other components within normal limits  CBC WITH DIFFERENTIAL/PLATELET - Abnormal; Notable for the following components:   WBC 21.1 (*)    RDW 15.0 (*)    Neutro Abs 15.1 (*)    Lymphs Abs 3.8 (*)    Monocytes Absolute 1.6 (*)    All other components within normal limits  LACTIC ACID, PLASMA  LACTIC ACID, PLASMA   ____________________________________________  EKG  None - EKG not ordered by ED physician ____________________________________________  RADIOLOGY [name], personally viewed and evaluated these images (CT scan) as part of my medical decision making, as well as reviewing the written report by the  radiologist.  ED MD interpretation: Facial cellulitis with early phlegmon/abscess and left submandibular sialolith  Official radiology report(s): Ct Maxillofacial W Contrast  Result Date: 06/12/2017 CLINICAL DATA:  Facial swelling and redness after pimple extraction 4 days ago. History of IV drug abuse. EXAM: CT MAXILLOFACIAL WITH CONTRAST TECHNIQUE: Multidetector CT imaging of the maxillofacial structures was performed with intravenous contrast. Multiplanar CT image reconstructions were also generated. CONTRAST:  60mL ISOVUE-370 IOPAMIDOL (ISOVUE-370) INJECTION 76% COMPARISON:  None. FINDINGS: OSSEOUS: The mandible is intact, the condyles are located. No acute facial fracture. No destructive bony lesions. Multiple dental caries with early tooth 8 periapical abscess. ORBITS: Ocular globes and orbital contents are normal. SINUSES: Paranasal sinuses are well aerated. Nasal septum is midline. Included mastoid aircells are well aerated. SOFT TISSUES: Mid lower face soft tissue swelling with subcutaneous fat stranding compatible with cellulitis LEFT periorbital soft tissues. No suspicious subcutaneous gas, single bubble of gas RIGHT premalar soft  tissues compatible with recent vascular access. 6 x 7 x 16 mm superficial ill-defined fluid collection at the level of the LEFT mandible within 4 mm of the skin surface. Associated skin thickening. Atrophic LEFT submandibular gland with multiple sialoliths measuring to 7 mm. 8 x 6 mm sialolith distal LEFT submandibular duct. LIMITED INTRACRANIAL: Normal. IMPRESSION: 1. LEFT periorbital, LEFT greater than RIGHT mid and lower facial cellulitis. LEFT lower face 6 x 7 x 16 mm superficial early abscess. 2. LEFT submandibular sialolith, atrophic LEFT submandibular gland. 3. Poor dentition. Electronically Signed   By: Awilda Metro M.D.   On: 06/12/2017 18:35    ____________________________________________   PROCEDURES  Critical Care performed: No   Procedure(s)  performed:   Procedures   ____________________________________________   INITIAL IMPRESSION / ASSESSMENT AND PLAN / ED COURSE  As part of my medical decision making, I reviewed the following data within the electronic MEDICAL RECORD NUMBER Nursing notes reviewed and incorporated, Labs reviewed , Old chart reviewed, A consult was requested and obtained from this/these consultant(s) ENT and Notes from prior ED visits    Differential diagnosis includes, but is not limited to, facial abscess, facial cellulitis, facial phlegmon, lymphadenitis.  The patient has a significant leukocytosis of 21 but normal vital signs and is afebrile.  Lactic acid is normal and he does not meet sepsis criteria.  CT maxillofacial with IV contrast demonstrates an extensive area of facial cellulitis but with no obviously drainable fluid collection but early phlegmon.  Dr. Andee Poles with ENT was in the department and stop by and saw the patient in person and reviewed the CT scan although he did not officially perform a consult.  We discussed the case and he feels that outpatient antibiotics is appropriate and I suggested Bactrim DS 2 tablets p.o. twice daily for 10 days as well as Keflex 500 mg 4 times p.o. 4 times daily x10 days as a standard treatment, and he agreed.  He also agreed with the plan for Decadron 10 mg IV.  He will see the patient in clinic in 3 days for follow-up.  I gave my usual and customary return precautions.   Clinical Course as of Jun 12 2052  Tue Jun 12, 2017  2032 I reviewed the patient's prescription history over the last 24 months in the multi-state controlled substances database(s) that includes [place], [place], [place], [place], [place], [place], [place], [place], [place], [place], [place], [place], [place], [place], and [place].  Results were notable for only one small prescription for Norco prescribed about 2 months ago, the patient is low risk for abuse  potential, although his medical history includes "IV drug user".   [CF]    Clinical Course User Index [CF] Loleta Rose, MD    ____________________________________________  FINAL CLINICAL IMPRESSION(S) / ED DIAGNOSES  Final diagnoses:  Facial cellulitis  Left facial swelling     MEDICATIONS GIVEN DURING THIS VISIT:  Medications  iopamidol (ISOVUE-370) 76 % injection 60 mL (60 mLs Intravenous Contrast Given 06/12/17 1808)  dexamethasone (DECADRON) injection 10 mg (10 mg Intravenous Given 06/12/17 2034)  sulfamethoxazole-trimethoprim (BACTRIM DS,SEPTRA DS) 800-160 MG per tablet 2 tablet (2 tablets Oral Given 06/12/17 2034)  cephALEXin (KEFLEX) capsule 500 mg (500 mg Oral Given 06/12/17 2034)     ED Discharge Orders        Ordered    sulfamethoxazole-trimethoprim (BACTRIM DS,SEPTRA DS) 800-160 MG tablet  2 times daily     06/12/17 2051    cephALEXin (KEFLEX) 500  MG capsule  4 times daily     06/12/17 2051       Note:  This document was prepared using Dragon voice recognition software and may include unintentional dictation errors.    Loleta Rose, MD 06/12/17 [phone]

## 2017-06-12 NOTE — ED Triage Notes (Signed)
Pt arrived via POV with reports of left facial swelling. Pt states he had pimple in beard and states he popped it and face started swelling and reddened for the past 4 days.    Pt states swelling worse today. Pt denies any pain inside his mouth.  Pt holding rag to face.

## 2017-06-12 NOTE — Discharge Instructions (Signed)
As we discussed, you have an infection in the left side of your face and cheek, but nothing to be drained at this time.  It is very important that you take both prescribed antibiotics for the full 10 days of treatment unless Dr. Andee Poles tells you at your follow-up appointment that you can stop taking them.  Please fill your prescription first thing in the morning and take your next dose of medication.  Call his clinic to schedule follow-up appointment for Friday and let them know that Dr. Andee Poles was the one that ask you to have an appointment on Friday.  Return to the emergency department if you develop new or worsening symptoms that concern you.

## 2017-07-13 ENCOUNTER — Emergency Department
Admission: EM | Admit: 2017-07-13 | Discharge: 2017-07-13 | Disposition: A | Discharge status: Home / Self Care | Payer: Self-pay | Attending: Emergency Medicine | Admitting: Emergency Medicine

## 2017-07-13 DIAGNOSIS — R2242 Localized swelling, mass and lump, left lower limb: Secondary | ICD-10-CM | POA: Insufficient documentation

## 2017-07-13 DIAGNOSIS — Z5321 Procedure and treatment not carried out due to patient leaving prior to being seen by health care provider: Secondary | ICD-10-CM | POA: Insufficient documentation

## 2017-07-13 LAB — COMPREHENSIVE METABOLIC PANEL
ALT: 54 U/L — ABNORMAL HIGH (ref 0–44)
AST: 33 U/L (ref 15–41)
Albumin: 3.8 g/dL (ref 3.5–5.0)
Alkaline Phosphatase: 98 U/L (ref 38–126)
Anion gap: 10 (ref 5–15)
BILIRUBIN TOTAL: 0.3 mg/dL (ref 0.3–1.2)
BUN: 7 mg/dL (ref 6–20)
CHLORIDE: 98 mmol/L (ref 98–111)
CO2: 28 mmol/L (ref 22–32)
CREATININE: 0.72 mg/dL (ref 0.61–1.24)
Calcium: 8.8 mg/dL — ABNORMAL LOW (ref 8.9–10.3)
Glucose, Bld: 118 mg/dL — ABNORMAL HIGH (ref 70–99)
POTASSIUM: 3.7 mmol/L (ref 3.5–5.1)
Sodium: 136 mmol/L (ref 135–145)
Total Protein: 7.6 g/dL (ref 6.5–8.1)

## 2017-07-13 LAB — CBC WITH DIFFERENTIAL/PLATELET
BASOS ABS: 0 10*3/uL (ref 0–0.1)
Basophils Relative: 0 %
EOS PCT: 7 %
Eosinophils Absolute: 1 10*3/uL — ABNORMAL HIGH (ref 0–0.7)
HEMATOCRIT: 35.2 % — AB (ref 40.0–52.0)
Hemoglobin: 11.8 g/dL — ABNORMAL LOW (ref 13.0–18.0)
LYMPHS ABS: 3.6 10*3/uL (ref 1.0–3.6)
LYMPHS PCT: 26 %
MCH: 28.1 pg (ref 26.0–34.0)
MCHC: 33.5 g/dL (ref 32.0–36.0)
MCV: 83.7 fL (ref 80.0–100.0)
MONO ABS: 1.5 10*3/uL — AB (ref 0.2–1.0)
MONOS PCT: 11 %
NEUTROS ABS: 7.8 10*3/uL — AB (ref 1.4–6.5)
Neutrophils Relative %: 56 %
PLATELETS: 268 10*3/uL (ref 150–440)
RBC: 4.21 MIL/uL — ABNORMAL LOW (ref 4.40–5.90)
RDW: 15.4 % — AB (ref 11.5–14.5)
WBC: 13.9 10*3/uL — ABNORMAL HIGH (ref 3.8–10.6)

## 2017-07-13 NOTE — ED Triage Notes (Signed)
Patient c/o redness, swelling to left medial ankle. Patient recently dx with cellulitis of left face and treated with antibiotics. Patient denies injury to ankle, or hx of cellulitis in ankle.
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
